# Patient Record
Sex: Female | Born: 1945 | Race: Asian | Hispanic: No | Smoking: Never smoker
Health system: Southern US, Community
[De-identification: ages and names within clinical notes are randomized; demographics above are authoritative.]

## PROBLEM LIST (undated history)

## (undated) DIAGNOSIS — E785 Hyperlipidemia, unspecified: Secondary | ICD-10-CM

## (undated) DIAGNOSIS — I10 Essential (primary) hypertension: Secondary | ICD-10-CM

## (undated) DIAGNOSIS — N390 Urinary tract infection, site not specified: Secondary | ICD-10-CM

## (undated) DIAGNOSIS — M1990 Unspecified osteoarthritis, unspecified site: Secondary | ICD-10-CM

## (undated) HISTORY — PX: CATARACT EXTRACTION: SUR2

## (undated) HISTORY — DX: Essential (primary) hypertension: I10

## (undated) HISTORY — DX: Unspecified osteoarthritis, unspecified site: M19.90

## (undated) HISTORY — DX: Urinary tract infection, site not specified: N39.0

## (undated) HISTORY — DX: Hyperlipidemia, unspecified: E78.5

---

## 2019-06-27 ENCOUNTER — Telehealth: Payer: Self-pay

## 2019-06-27 NOTE — Telephone Encounter (Signed)
Copied from [place] [id number]. Topic: General - Other >> Jun 27, 2019  3:04 PM Wynetta Emery, [place] C wrote: Reason for CRM: pt and daughter called in to change apt to in office. Daughter says that mom doesn't need interpreter because she will be with her at her apt. Called the office twice for assistance both times was advised that visit could be changed to in office but call was disconnected. Please assist pt directly if further apt instructions are needed, apt has been changed to in office per advised.

## 2019-06-28 ENCOUNTER — Other Ambulatory Visit: Payer: Self-pay

## 2019-06-30 ENCOUNTER — Encounter: Payer: Self-pay | Admitting: Internal Medicine

## 2019-06-30 ENCOUNTER — Other Ambulatory Visit: Payer: Self-pay

## 2019-06-30 ENCOUNTER — Ambulatory Visit (INDEPENDENT_AMBULATORY_CARE_PROVIDER_SITE_OTHER): Payer: Self-pay | Admitting: Internal Medicine

## 2019-06-30 VITALS — BP 126/80 | HR 70 | Temp 97.1°F | Resp 16 | Wt 135.6 lb

## 2019-06-30 DIAGNOSIS — Z1329 Encounter for screening for other suspected endocrine disorder: Secondary | ICD-10-CM

## 2019-06-30 DIAGNOSIS — I10 Essential (primary) hypertension: Secondary | ICD-10-CM

## 2019-06-30 DIAGNOSIS — R739 Hyperglycemia, unspecified: Secondary | ICD-10-CM

## 2019-06-30 DIAGNOSIS — E785 Hyperlipidemia, unspecified: Secondary | ICD-10-CM | POA: Insufficient documentation

## 2019-06-30 DIAGNOSIS — E559 Vitamin D deficiency, unspecified: Secondary | ICD-10-CM

## 2019-06-30 DIAGNOSIS — R300 Dysuria: Secondary | ICD-10-CM

## 2019-06-30 DIAGNOSIS — Z1231 Encounter for screening mammogram for malignant neoplasm of breast: Secondary | ICD-10-CM

## 2019-06-30 DIAGNOSIS — R1030 Lower abdominal pain, unspecified: Secondary | ICD-10-CM

## 2019-06-30 DIAGNOSIS — M069 Rheumatoid arthritis, unspecified: Secondary | ICD-10-CM

## 2019-06-30 MED ORDER — LISINOPRIL-HYDROCHLOROTHIAZIDE 20-25 MG PO TABS: 1.0000 | ORAL_TABLET | Freq: Every day | ORAL | 3 refills | Status: DC

## 2019-06-30 MED ORDER — CIPROFLOXACIN HCL 250 MG PO TABS: 250.0000 mg | ORAL_TABLET | Freq: Two times a day (BID) | ORAL | 0 refills | Status: DC

## 2019-06-30 MED ORDER — SIMVASTATIN 20 MG PO TABS: 20.0000 mg | ORAL_TABLET | Freq: Every day | ORAL | 3 refills | Status: DC

## 2019-06-30 MED ORDER — METOPROLOL TARTRATE 50 MG PO TABS: 50.0000 mg | ORAL_TABLET | Freq: Two times a day (BID) | ORAL | 3 refills | Status: DC

## 2019-06-30 NOTE — Patient Instructions (Addendum)
Vaccines recommendations if you have not had    Think about shingrix vaccine  Tdap vaccine (now) prevnar 13 vaccine due 04/29/2020 Flu shot high dose (now)   Trigger Finger  Trigger finger (stenosing tenosynovitis) is a condition that causes a finger to get stuck in a bent position. Each finger has a tough, cord-like tissue that connects muscle to bone (tendon), and each tendon is surrounded by a tunnel of tissue (tendon sheath). To move your finger, your tendon needs to slide freely through the sheath. Trigger finger happens when the tendon or the sheath thickens, making it difficult to move your finger. Trigger finger can affect any finger or a thumb. It may affect more than one finger. Mild cases may clear up with rest and medicine. Severe cases require more treatment. What are the causes? Trigger finger is caused by a thickened finger tendon or tendon sheath. The cause of this thickening is not known. What increases the risk? The following factors may make you more likely to develop this condition:  Doing activities that require a strong grip.  Having rheumatoid arthritis, gout, or diabetes.  Being 34-46 years old.  Being a woman. What are the signs or symptoms? Symptoms of this condition include:  Pain when bending or straightening your finger.  Tenderness or swelling where your finger attaches to the palm of your hand.  A lump in the palm of your hand or on the inside of your finger.  Hearing a popping sound when you try to straighten your finger.  Feeling a popping, catching, or locking sensation when you try to straighten your finger.  Being unable to straighten your finger. How is this diagnosed? This condition is diagnosed based on your symptoms and a physical exam. How is this treated? This condition may be treated by:  Resting your finger and avoiding activities that make symptoms worse.  Wearing a finger splint to keep your finger in a slightly bent position.   Taking NSAIDs to relieve pain and swelling.  Injecting medicine (steroids) into the tendon sheath to reduce swelling and irritation. Injections may need to be repeated.  Having surgery to open the tendon sheath. This may be done if other treatments do not work and you cannot straighten your finger. You may need physical therapy after surgery. Follow these instructions at home:   Use moist heat to help reduce pain and swelling as told by your health care provider.  Rest your finger and avoid activities that make pain worse. Return to normal activities as told by your health care provider.  If you have a splint, wear it as told by your health care provider.  Take over-the-counter and prescription medicines only as told by your health care provider.  Keep all follow-up visits as told by your health care provider. This is important. Contact a health care provider if:  Your symptoms are not improving with home care. Summary  Trigger finger (stenosing tenosynovitis) causes your finger to get stuck in a bent position, and it can make it difficult and painful to straighten your finger.  This condition develops when a finger tendon or tendon sheath thickens.  Treatment starts with resting, wearing a splint, and taking NSAIDs.  In severe cases, surgery to open the tendon sheath may be needed. This information is not intended to replace advice given to you by your health care provider. Make sure you discuss any questions you have with your health care provider. Document Released: 08/08/2004 Document Revised: 10/01/2017 Document Reviewed: 09/29/2016 Elsevier  Patient Education  2020 [place] A mammogram is an X-ray of the breasts that is done to check for changes that are not normal. This test can screen for and find any changes that may suggest breast cancer. Mammograms are regularly done on women. A man may have a mammogram if he has a lump or swelling in his breast. This  test can also help to find other changes and variations in the breast. Tell a doctor:  About any allergies you have.  If you have breast implants.  If you have had previous breast disease, biopsy, or surgery.  If you are breastfeeding.  If you are younger than age 34.  If you have a family history of breast cancer.  Whether you are pregnant or may be pregnant. What are the risks? Generally, this is a safe procedure. However, problems may occur, including:  Exposure to radiation. Radiation levels are very low with this test.  The results being misinterpreted.  The need for further tests.  The inability of the mammogram to detect certain cancers. What happens before the procedure?  Have this test done about 1-2 weeks after your period. This is usually when your breasts are the least tender.  If you are visiting a new doctor or clinic, send any past mammogram images to your new doctor's office.  Wash your breasts and under your arms the day of the test.  Do not use deodorants, perfumes, lotions, or powders on the day of the test.  Take off any jewelry from your neck.  Wear clothes that you can change into and out of easily. What happens during the procedure?   You will undress from the waist up. You will put on a gown.  You will stand in front of the X-ray machine.  Each breast will be placed between two plastic or glass plates. The plates will press down on your breast for a few seconds. Try to stay as relaxed as possible. This does not cause any harm to your breasts. Any discomfort you feel will be very brief.  X-rays will be taken from different angles of each breast. The procedure may vary among doctors and hospitals. What happens after the procedure?  The mammogram will be read by a specialist (radiologist).  You may need to do certain parts of the test again. This depends on the quality of the images.  Ask when your test results will be ready. Make sure you  get your test results.  You may go back to your normal activities. Summary  A mammogram is a low energy X-ray of the breasts that is done to check for abnormal changes. A man may have this test if he has a lump or swelling in his breast.  Before the procedure, tell your doctor about any breast problems that you have had in the past.  Have this test done about 1-2 weeks after your period.  For the test, each breast will be placed between two plastic or glass plates. The plates will press down on your breast for a few seconds.  The mammogram will be read by a specialist (radiologist). Ask when your test results will be ready. Make sure you get your test results. This information is not intended to replace advice given to you by your health care provider. Make sure you discuss any questions you have with your health care provider. Document Released: 01/15/2009 Document Revised: 06/09/2018 Document Reviewed: 06/09/2018 Elsevier Patient Education  2020 [employer].  DASH Eating Plan DASH stands for "Dietary Approaches to Stop Hypertension." The DASH eating plan is a healthy eating plan that has been shown to reduce high blood pressure (hypertension). It may also reduce your risk for type 2 diabetes, heart disease, and stroke. The DASH eating plan may also help with weight loss. What are tips for following this plan?  General guidelines  Avoid eating more than 2,300 mg (milligrams) of salt (sodium) a day. If you have hypertension, you may need to reduce your sodium intake to 1,500 mg a day.  Limit alcohol intake to no more than 1 drink a day for nonpregnant women and 2 drinks a day for men. One drink equals 12 oz of beer, 5 oz of wine, or 1 oz of hard liquor.  Work with your health care provider to maintain a healthy body weight or to lose weight. Ask what an ideal weight is for you.  Get at least 30 minutes of exercise that causes your heart to beat faster (aerobic exercise) most days of  the week. Activities may include walking, swimming, or biking.  Work with your health care provider or diet and nutrition specialist (dietitian) to adjust your eating plan to your individual calorie needs. Reading food labels   Check food labels for the amount of sodium per serving. Choose foods with less than 5 percent of the Daily Value of sodium. Generally, foods with less than 300 mg of sodium per serving fit into this eating plan.  To find whole grains, look for the word "whole" as the first word in the ingredient list. Shopping  Buy products labeled as "low-sodium" or "no salt added."  Buy fresh foods. Avoid canned foods and premade or frozen meals. Cooking  Avoid adding salt when cooking. Use salt-free seasonings or herbs instead of table salt or sea salt. Check with your health care provider or pharmacist before using salt substitutes.  Do not fry foods. Cook foods using healthy methods such as baking, boiling, grilling, and broiling instead.  Cook with heart-healthy oils, such as olive, canola, soybean, or sunflower oil. Meal planning  Eat a balanced diet that includes: ? 5 or more servings of fruits and vegetables each day. At each meal, try to fill half of your plate with fruits and vegetables. ? Up to 6-8 servings of whole grains each day. ? Less than 6 oz of lean meat, poultry, or fish each day. A 3-oz serving of meat is about the same size as a deck of cards. One egg equals 1 oz. ? 2 servings of low-fat dairy each day. ? A serving of nuts, seeds, or beans 5 times each week. ? Heart-healthy fats. Healthy fats called Omega-3 fatty acids are found in foods such as flaxseeds and coldwater fish, like sardines, salmon, and mackerel.  Limit how much you eat of the following: ? Canned or prepackaged foods. ? Food that is high in trans fat, such as fried foods. ? Food that is high in saturated fat, such as fatty meat. ? Sweets, desserts, sugary drinks, and other foods with  added sugar. ? Full-fat dairy products.  Do not salt foods before eating.  Try to eat at least 2 vegetarian meals each week.  Eat more home-cooked food and less restaurant, buffet, and fast food.  When eating at a restaurant, ask that your food be prepared with less salt or no salt, if possible. What foods are recommended? The items listed may not be a complete list. Talk with your dietitian  about what dietary choices are best for you. Grains Whole-grain or whole-wheat bread. Whole-grain or whole-wheat pasta. Brown rice. [place]. Bulgur. Whole-grain and low-sodium cereals. Pita bread. Low-fat, low-sodium crackers. Whole-wheat flour tortillas. Vegetables Fresh or frozen vegetables (raw, steamed, roasted, or grilled). Low-sodium or reduced-sodium tomato and vegetable juice. Low-sodium or reduced-sodium tomato sauce and tomato paste. Low-sodium or reduced-sodium canned vegetables. Fruits All fresh, dried, or frozen fruit. Canned fruit in natural juice (without added sugar). Meat and other protein foods Skinless chicken or [place]. Ground chicken or [place]. Pork with fat trimmed off. Fish and seafood. Egg whites. Dried beans, peas, or lentils. Unsalted nuts, nut butters, and seeds. Unsalted canned beans. Lean cuts of beef with fat trimmed off. Low-sodium, lean deli meat. Dairy Low-fat (1%) or fat-free (skim) milk. Fat-free, low-fat, or reduced-fat cheeses. Nonfat, low-sodium ricotta or cottage cheese. Low-fat or nonfat yogurt. Low-fat, low-sodium cheese. Fats and oils Soft margarine without trans fats. Vegetable oil. Low-fat, reduced-fat, or light mayonnaise and salad dressings (reduced-sodium). Canola, safflower, olive, soybean, and sunflower oils. Avocado. Seasoning and other foods Herbs. Spices. Seasoning mixes without salt. Unsalted popcorn and pretzels. Fat-free sweets. What foods are not recommended? The items listed may not be a complete list. Talk with your dietitian about what  dietary choices are best for you. Grains Baked goods made with fat, such as croissants, muffins, or some breads. Dry pasta or rice meal packs. Vegetables Creamed or fried vegetables. Vegetables in a cheese sauce. Regular canned vegetables (not low-sodium or reduced-sodium). Regular canned tomato sauce and paste (not low-sodium or reduced-sodium). Regular tomato and vegetable juice (not low-sodium or reduced-sodium). Angie Fava. Olives. Fruits Canned fruit in a light or heavy syrup. Fried fruit. Fruit in cream or butter sauce. Meat and other protein foods Fatty cuts of meat. Ribs. Fried meat. Berniece Salines. Sausage. Bologna and other processed lunch meats. Salami. Fatback. Hotdogs. Bratwurst. Salted nuts and seeds. Canned beans with added salt. Canned or smoked fish. Whole eggs or egg yolks. Chicken or [place] with skin. Dairy Whole or 2% milk, cream, and half-and-half. Whole or full-fat cream cheese. Whole-fat or sweetened yogurt. Full-fat cheese. Nondairy creamers. Whipped toppings. Processed cheese and cheese spreads. Fats and oils Butter. Stick margarine. Lard. Shortening. Ghee. Bacon fat. Tropical oils, such as coconut, palm kernel, or palm oil. Seasoning and other foods Salted popcorn and pretzels. Onion salt, garlic salt, seasoned salt, table salt, and sea salt. Worcestershire sauce. Tartar sauce. Barbecue sauce. Teriyaki sauce. Soy sauce, including reduced-sodium. Steak sauce. Canned and packaged gravies. Fish sauce. Oyster sauce. Cocktail sauce. Horseradish that you find on the shelf. Ketchup. Mustard. Meat flavorings and tenderizers. Bouillon cubes. Hot sauce and Tabasco sauce. Premade or packaged marinades. Premade or packaged taco seasonings. Relishes. Regular salad dressings. Where to find more information:  National Heart, Lung, and [place]: https://wilson-eaton.com/  American Heart Association: www.heart.org Summary  The DASH eating plan is a healthy eating plan that has been shown to reduce  high blood pressure (hypertension). It may also reduce your risk for type 2 diabetes, heart disease, and stroke.  With the DASH eating plan, you should limit salt (sodium) intake to 2,300 mg a day. If you have hypertension, you may need to reduce your sodium intake to 1,500 mg a day.  When on the DASH eating plan, aim to eat more fresh fruits and vegetables, whole grains, lean proteins, low-fat dairy, and heart-healthy fats.  Work with your health care provider or diet and nutrition specialist (dietitian) to adjust your eating plan to  your individual calorie needs. This information is not intended to replace advice given to you by your health care provider. Make sure you discuss any questions you have with your health care provider. Document Released: 10/08/2011 Document Revised: 10/01/2017 Document Reviewed: 10/12/2016 Elsevier Patient Education  2020 [employer].

## 2019-06-30 NOTE — Progress Notes (Signed)
. Chief Complaint  Patient presents with  . Establish Care   New patient presents with daughter [name]  1. HTN controlled today on metoprolol 50 mg bid and lis-hctz 20-25  Mg qd she wants refills  2. HLD need to check fating labs currently no insurance given [facility] form on zocor 20 mg qhs 3. C/o reduced vision in right eye and h/o b/l cataract surgery  4. C/o right lower back pain, right hip pain and left shoulder pain she takes Tylenol at times which helps but Ibuprofen helps more. She is wearing abdominal binder brace today to help with back pain 5. C/o lower middle abdominal pain with h/o enterococcus UTI, denies burning with urination only lower abdominal pain she does wear panty liner but no h/o incontinence   Review of Systems  Constitutional: Negative for weight loss.  HENT: Negative for hearing loss.   Eyes: Negative for blurred vision.  Respiratory: Negative for shortness of breath.   Cardiovascular: Negative for chest pain.  Gastrointestinal: Positive for abdominal pain.  Genitourinary: Negative for dysuria.  Musculoskeletal: Positive for back pain and joint pain. Negative for falls.  Skin: Negative for rash.  Neurological: Negative for headaches.  Psychiatric/Behavioral: Negative for depression.   Past Medical History:  Diagnosis Date  . Arthritis    left shoulder, right low back and right hip   . Hyperlipidemia   . Hypertension   . UTI (urinary tract infection)    Past Surgical History:  Procedure Laterality Date  . CATARACT EXTRACTION     b/l    Family History  Problem Relation Age of Onset  . Breast cancer Other    Social History   Socioeconomic History  . Marital status: Divorced    Spouse name: Not on file  . Number of children: Not on file  . Years of education: Not on file  . Highest education level: Not on file  Occupational History  . Not on file  Social Needs  . Financial resource strain: Not on file  . Food insecurity    Worry: Not on file     Inability: Not on file  . Transportation needs    Medical: Not on file    Non-medical: Not on file  Tobacco Use  . Smoking status: Never Smoker  . Smokeless tobacco: Never Used  Substance and Sexual Activity  . Alcohol use: Not Currently  . Drug use: Not Currently  . Sexual activity: Not Currently  Lifestyle  . Physical activity    Days per week: Not on file    Minutes per session: Not on file  . Stress: Not on file  Relationships  . Social Herbalist on phone: Not on file    Gets together: Not on file    Attends religious service: Not on file    Active member of club or organization: Not on file    Attends meetings of clubs or organizations: Not on file    Relationship status: Not on file  . Intimate partner violence    Fear of current or ex partner: Not on file    Emotionally abused: Not on file    Physically abused: Not on file    Forced sexual activity: Not on file  Other Topics Concern  . Not on file  Social History Narrative   4 daughters    [place] here from [place], [place].    Currently not working and pending insurance as of 06/30/19    From the [employer]  Current Meds  Medication Sig  . Ascorbic Acid (VITAMIN C PO) Take by mouth daily.  Marland Kitchen aspirin EC 81 MG tablet Take 81 mg by mouth daily.  . Cholecalciferol 25 MCG (1000 UT) capsule Take 1,000 Units by mouth daily.  Marland Kitchen GLUTAMINE PO Take by mouth daily.  Marland Kitchen lisinopril-hydrochlorothiazide (ZESTORETIC) 20-25 MG tablet Take 1 tablet by mouth daily.  . metoprolol tartrate (LOPRESSOR) 50 MG tablet Take 1 tablet (50 mg total) by mouth 2 (two) times daily.  . multivitamin-lutein (OCUVITE-LUTEIN) CAPS capsule Take 1 capsule by mouth daily.  . Omega-3 Fatty Acids (OMEGA 3 PO) Take by mouth daily.  . simvastatin (ZOCOR) 20 MG tablet Take 1 tablet (20 mg total) by mouth daily at 6 PM.  . [DISCONTINUED] lisinopril-hydrochlorothiazide (ZESTORETIC) 20-25 MG tablet Take 1 tablet by mouth daily.  . [DISCONTINUED]  metoprolol tartrate (LOPRESSOR) 50 MG tablet Take 50 mg by mouth 2 (two) times daily.  . [DISCONTINUED] simvastatin (ZOCOR) 20 MG tablet Take 20 mg by mouth daily.   No Known Allergies No results found for this or any previous visit (from the past 2160 hour(s)). Objective  There is no height or weight on file to calculate BMI. Wt Readings from Last 3 Encounters:  06/30/19 135 lb 9.6 oz (61.5 kg)   Temp Readings from Last 3 Encounters:  06/30/19 (!) 97.1 F (36.2 C) (Temporal)   BP Readings from Last 3 Encounters:  06/30/19 126/80   Pulse Readings from Last 3 Encounters:  06/30/19 70    Physical Exam Vitals signs and nursing note reviewed.  Constitutional:      Appearance: Normal appearance. She is well-developed.  HENT:     Head: Normocephalic and atraumatic.     Comments: +mask on   Cardiovascular:     Rate and Rhythm: Normal rate and regular rhythm.     Heart sounds: Normal heart sounds. No murmur.  Pulmonary:     Effort: Pulmonary effort is normal.     Breath sounds: Normal breath sounds.  Musculoskeletal:     Lumbar back: She exhibits tenderness.       Back:  Skin:    General: Skin is warm and dry.  Neurological:     General: No focal deficit present.     Mental Status: She is alert and oriented to person, place, and time. Mental status is at baseline.     Gait: Gait normal.  Psychiatric:        Attention and Perception: Attention and perception normal.        Mood and Affect: Mood and affect normal.        Speech: Speech normal.        Behavior: Behavior normal. Behavior is cooperative.        Thought Content: Thought content normal.        Cognition and Memory: Cognition and memory normal.        Judgment: Judgment normal.     Assessment  Plan  Essential hypertension, controlled - Plan: metoprolol tartrate (LOPRESSOR) 50 MG tablet mg bid, lisinopril-hydrochlorothiazide (ZESTORETIC) 20-25 MG tablet qd -given fasting labs to do a [facility] pt is self pay    Lower suprapubic abdominal pain r/o UTI with h/o enterococcus UTI - Plan: Urinalysis, Routine w reflex microscopic, Urine Culture, ciprofloxacin (CIPRO) 250 MG tablet, -if urine culture negative consider abdominal imaging with CT ab/pelvis in future currently w/o insurance   Hyperlipidemia, unspecified hyperlipidemia type - Plan: simvastatin (ZOCOR) 20 MG tablet, Lipid panel  Hyperglycemia -  Plan: Hemoglobin A1c  Arthritis likely low back right and right hip and left shoulder  -consider Xrays in future   HM She will get flu shot elsewhere due to cost currently no insurance also rec health dept rec Tdap, shingrix, prevnar if not had  She does report she had pna 23 vaccine 04/30/19   Consider mammogram when she gets insurance ordered Teola Bradley it is $99 Consider cologuard when she gets insurance  Out of age window pap  Consider DEXA in future  Will need referral to eye MD when gets insurance right eye bothersome to her   Former PCP Dr. Marguerita Beards in [place] try to get records   Provider: Dr. Olivia Mackie McLean-[facility]

## 2019-07-01 LAB — MICROSCOPIC EXAMINATION: Casts: NONE SEEN /lpf

## 2019-07-01 LAB — URINALYSIS, ROUTINE W REFLEX MICROSCOPIC
Bilirubin, UA: NEGATIVE
Glucose, UA: NEGATIVE
Ketones, UA: NEGATIVE
Nitrite, UA: NEGATIVE
Protein,UA: NEGATIVE
RBC, UA: NEGATIVE
Specific Gravity, UA: 1.008 (ref 1.005–1.030)
Urobilinogen, Ur: 0.2 mg/dL (ref 0.2–1.0)
pH, UA: 6.5 (ref 5.0–7.5)

## 2019-07-02 LAB — URINE CULTURE

## 2019-08-18 ENCOUNTER — Emergency Department: Payer: Medicaid Other

## 2019-08-18 ENCOUNTER — Emergency Department
Admission: EM | Admit: 2019-08-18 | Discharge: 2019-08-18 | Disposition: A | Discharge status: Home / Self Care | Payer: Medicaid Other | Attending: Emergency Medicine | Admitting: Emergency Medicine

## 2019-08-18 ENCOUNTER — Other Ambulatory Visit: Payer: Self-pay

## 2019-08-18 ENCOUNTER — Encounter: Payer: Self-pay | Admitting: Emergency Medicine

## 2019-08-18 DIAGNOSIS — K3189 Other diseases of stomach and duodenum: Secondary | ICD-10-CM | POA: Diagnosis not present

## 2019-08-18 DIAGNOSIS — Z7982 Long term (current) use of aspirin: Secondary | ICD-10-CM | POA: Diagnosis not present

## 2019-08-18 DIAGNOSIS — R1011 Right upper quadrant pain: Secondary | ICD-10-CM | POA: Diagnosis present

## 2019-08-18 DIAGNOSIS — Z79899 Other long term (current) drug therapy: Secondary | ICD-10-CM | POA: Diagnosis not present

## 2019-08-18 DIAGNOSIS — I10 Essential (primary) hypertension: Secondary | ICD-10-CM | POA: Diagnosis not present

## 2019-08-18 LAB — URINALYSIS, ROUTINE W REFLEX MICROSCOPIC
Bilirubin Urine: NEGATIVE
Glucose, UA: NEGATIVE mg/dL
Hgb urine dipstick: NEGATIVE
Ketones, ur: NEGATIVE mg/dL
Leukocytes,Ua: NEGATIVE
Nitrite: NEGATIVE
Protein, ur: NEGATIVE mg/dL
Specific Gravity, Urine: 1.014 (ref 1.005–1.030)
pH: 7 (ref 5.0–8.0)

## 2019-08-18 LAB — BASIC METABOLIC PANEL
Anion gap: 8 (ref 5–15)
BUN: 12 mg/dL (ref 8–23)
CO2: 28 mmol/L (ref 22–32)
Calcium: 9.2 mg/dL (ref 8.9–10.3)
Chloride: 96 mmol/L — ABNORMAL LOW (ref 98–111)
Creatinine, Ser: 0.82 mg/dL (ref 0.44–1.00)
GFR calc Af Amer: >60 mL/min (ref >60)
GFR calc non Af Amer: >60 mL/min (ref >60)
Glucose, Bld: 97 mg/dL (ref 70–99)
Potassium: 3.9 mmol/L (ref 3.5–5.1)
Sodium: 132 mmol/L — ABNORMAL LOW (ref 135–145)

## 2019-08-18 LAB — COMPREHENSIVE METABOLIC PANEL
ALT: 23 U/L (ref 0–44)
AST: 26 U/L (ref 15–41)
Albumin: 4.2 g/dL (ref 3.5–5.0)
Alkaline Phosphatase: 53 U/L (ref 38–126)
Anion gap: 11 (ref 5–15)
BUN: 16 mg/dL (ref 8–23)
CO2: 27 mmol/L (ref 22–32)
Calcium: 9.1 mg/dL (ref 8.9–10.3)
Chloride: 89 mmol/L — ABNORMAL LOW (ref 98–111)
Creatinine, Ser: 0.82 mg/dL (ref 0.44–1.00)
GFR calc Af Amer: >60 mL/min (ref >60)
GFR calc non Af Amer: >60 mL/min (ref >60)
Glucose, Bld: 114 mg/dL — ABNORMAL HIGH (ref 70–99)
Potassium: 4 mmol/L (ref 3.5–5.1)
Sodium: 127 mmol/L — ABNORMAL LOW (ref 135–145)
Total Bilirubin: 0.7 mg/dL (ref 0.3–1.2)
Total Protein: 7.3 g/dL (ref 6.5–8.1)

## 2019-08-18 LAB — CBC WITH DIFFERENTIAL/PLATELET
Abs Immature Granulocytes: 0.01 10*3/uL (ref 0.00–0.07)
Basophils Absolute: 0 10*3/uL (ref 0.0–0.1)
Basophils Relative: 1 %
Eosinophils Absolute: 0.3 10*3/uL (ref 0.0–0.5)
Eosinophils Relative: 6 %
HCT: 35.2 % — ABNORMAL LOW (ref 36.0–46.0)
Hemoglobin: 12.2 g/dL (ref 12.0–15.0)
Immature Granulocytes: 0 %
Lymphocytes Relative: 23 %
Lymphs Abs: 0.9 10*3/uL (ref 0.7–4.0)
MCH: 29.8 pg (ref 26.0–34.0)
MCHC: 34.7 g/dL (ref 30.0–36.0)
MCV: 85.9 fL (ref 80.0–100.0)
Monocytes Absolute: 0.4 10*3/uL (ref 0.1–1.0)
Monocytes Relative: 10 %
Neutro Abs: 2.4 10*3/uL (ref 1.7–7.7)
Neutrophils Relative %: 60 %
Platelets: 219 10*3/uL (ref 150–400)
RBC: 4.1 MIL/uL (ref 3.87–5.11)
RDW: 12 % (ref 11.5–15.5)
WBC: 4 10*3/uL (ref 4.0–10.5)
nRBC: 0 % (ref 0.0–0.2)

## 2019-08-18 LAB — LIPASE, BLOOD: Lipase: 39 U/L (ref 11–51)

## 2019-08-18 MED ORDER — IOHEXOL 300 MG/ML  SOLN
100.0000 mL | Freq: Once | INTRAMUSCULAR | Status: AC | PRN
  Administered 2019-08-18: 100 mL via INTRAVENOUS

## 2019-08-18 MED ORDER — FENTANYL CITRATE (PF) 100 MCG/2ML IJ SOLN
25.0000 ug | Freq: Once | INTRAMUSCULAR | Status: AC
  Administered 2019-08-18: 25 ug via INTRAVENOUS
  Filled 2019-08-18: qty 2

## 2019-08-18 MED ORDER — ONDANSETRON HCL 4 MG/2ML IJ SOLN
4.0000 mg | Freq: Once | INTRAMUSCULAR | Status: AC
  Administered 2019-08-18: 4 mg via INTRAVENOUS
  Filled 2019-08-18: qty 2

## 2019-08-18 MED ORDER — SODIUM CHLORIDE 0.9 % IV BOLUS
500.0000 mL | Freq: Once | INTRAVENOUS | Status: AC
  Administered 2019-08-18: 500 mL via INTRAVENOUS

## 2019-08-18 NOTE — ED Triage Notes (Signed)
Pt c/o lower abdominal pain that has been intermittent for 2 years. Daughter reports they have given lot of abx and told her it was UTI.  No vomiting or fever.  No diarrhea.  Family/patient unable to give detailed history of abdominal pain.

## 2019-08-18 NOTE — ED Notes (Signed)
Pt unhooked to urinate independently.

## 2019-08-18 NOTE — ED Provider Notes (Signed)
[facility] Emergency Department Provider Note  ____________________________________________   First MD Initiated Contact with Patient 08/18/19 [phone]     (approximate)  I have reviewed the triage vital signs and the nursing notes.   HISTORY  Chief Complaint Abdominal Pain    HPI Chelsey Lynch is a 73 y.o. female with hypertension, hyperlipidemia, recurrent UTIs who presents with lower abdominal pain.  Patient had intermittent lower abdominal pain for the past 2 months.  She says it before that it was going on for approximately 2 years but has been getting antibiotics on and off for recurrent UTIs that seem to be helping the pain.  The pain is moderate, intermittent, occasionally better with antibiotics, nothing makes it worse.  Patient has not had a colonoscopy, mammograms given patient is from the [place] and is currently working on [employer].  They is put in for approval 2 months ago.  They were told that she should have a CT scan done but given the insurance issue they have not been able to do it.  They presented today due to the pain getting increasingly worse than previous.  Denies any chest pain or shortness of breath.  Has a chronic cough.  No prior surgeries.       Past Medical History:  Diagnosis Date   Arthritis    left shoulder, right low back and right hip    Hyperlipidemia    Hypertension    UTI (urinary tract infection)     Patient Active Problem List   Diagnosis Date Noted   Essential hypertension 06/30/2019   Lower abdominal pain 06/30/2019   Hyperlipidemia 06/30/2019    Past Surgical History:  Procedure Laterality Date   CATARACT EXTRACTION     b/l     Prior to Admission medications   Medication Sig Start Date End Date Taking? Authorizing Provider  Ascorbic Acid (VITAMIN C PO) Take by mouth daily.    [provider]  aspirin EC 81 MG tablet Take 81 mg by mouth daily.    [provider]    Cholecalciferol 25 MCG (1000 UT) capsule Take 1,000 Units by mouth daily.    [provider]  ciprofloxacin (CIPRO) 250 MG tablet Take 1 tablet (250 mg total) by mouth 2 (two) times daily. With food 06/30/19   McLean-Scocuzza, Nino Glow, MD  GLUTAMINE PO Take by mouth daily.    [provider]  lisinopril-hydrochlorothiazide (ZESTORETIC) 20-25 MG tablet Take 1 tablet by mouth daily. 06/30/19   McLean-Scocuzza, Nino Glow, MD  metoprolol tartrate (LOPRESSOR) 50 MG tablet Take 1 tablet (50 mg total) by mouth 2 (two) times daily. 06/30/19   McLean-Scocuzza, Nino Glow, MD  multivitamin-lutein [facility]) CAPS capsule Take 1 capsule by mouth daily.    [provider]  Omega-3 Fatty Acids (OMEGA 3 PO) Take by mouth daily.    [provider]  simvastatin (ZOCOR) 20 MG tablet Take 1 tablet (20 mg total) by mouth daily at 6 PM. 06/30/19   McLean-Scocuzza, Nino Glow, MD    Allergies Patient has no known allergies.  Family History  Problem Relation Age of Onset   Breast cancer Other     Social History Social History   Tobacco Use   Smoking status: Never Smoker   Smokeless tobacco: Never Used  Substance Use Topics   Alcohol use: Not Currently   Drug use: Not Currently      Review of Systems Constitutional: No fever/chills Eyes: No visual changes. ENT: No sore  throat. Cardiovascular: Denies chest pain. Respiratory: Denies shortness of breath.  Positive cough Gastrointestinal: Positive abdominal pain no nausea, no vomiting.  No diarrhea.  No constipation. Genitourinary: Negative for dysuria. Musculoskeletal: Negative for back pain. Skin: Negative for rash. Neurological: Negative for headaches, focal weakness or numbness. All other ROS negative ____________________________________________   PHYSICAL EXAM:  VITAL SIGNS: ED Triage Vitals  Enc Vitals Group     BP 08/18/19 0749 (!) 147/93     Pulse Rate 08/18/19 0749 69     Resp 08/18/19 0749 18      Temp 08/18/19 0749 98.8 F (37.1 C)     Temp Source 08/18/19 0749 Oral     SpO2 08/18/19 0749 100 %     Weight 08/18/19 0747 127 lb (57.6 kg)     Height --      Head Circumference --      Peak Flow --      Pain Score 08/18/19 0745 9     Pain Loc --      Pain Edu? --      Excl. in [place]? --     Constitutional: Laying in bed, moaning in pain.. Eyes: Conjunctivae are normal. EOMI. Head: Atraumatic. Nose: No congestion/rhinnorhea. Mouth/Throat: Mucous membranes are moist.   Neck: No stridor. Trachea Midline. FROM Cardiovascular: Normal rate, regular rhythm. Grossly normal heart sounds.  Good peripheral circulation. Respiratory: Normal respiratory effort.  No retractions. Lungs CTAB. Gastrointestinal: Soft with tenderness in the lower abdomen.. No distention. No abdominal bruits.  Musculoskeletal: No lower extremity tenderness nor edema.  No joint effusions. Neurologic:  Normal speech and language. No gross focal neurologic deficits are appreciated.  Skin:  Skin is warm, dry and intact. No rash noted. Psychiatric: Mood and affect are normal. Speech and behavior are normal. GU: Deferred   ____________________________________________   LABS (all labs ordered are listed, but only abnormal results are displayed)  Labs Reviewed  CBC WITH DIFFERENTIAL/PLATELET - Abnormal; Notable for the following components:      Result Value   HCT 35.2 (*)    All other components within normal limits  COMPREHENSIVE METABOLIC PANEL - Abnormal; Notable for the following components:   Sodium 127 (*)    Chloride 89 (*)    Glucose, Bld 114 (*)    All other components within normal limits  URINALYSIS, ROUTINE W REFLEX MICROSCOPIC - Abnormal; Notable for the following components:   Color, Urine STRAW (*)    APPearance CLEAR (*)    All other components within normal limits  URINE CULTURE  LIPASE, BLOOD  BASIC METABOLIC PANEL   ____________________________________________   ED ECG REPORT I, Vanessa Westway, the attending physician, personally viewed and interpreted this ECG.  EKG is normal sinus rate of 59, no ST elevation, no T wave inversion, normal intervals. ____________________________________________  RADIOLOGY   Official radiology report(s): Ct Abdomen Pelvis W Contrast  Result Date: 08/18/2019 CLINICAL DATA:  Acute abdominal pain EXAM: CT ABDOMEN AND PELVIS WITH CONTRAST TECHNIQUE: Multidetector CT imaging of the abdomen and pelvis was performed using the standard protocol following bolus administration of intravenous contrast. CONTRAST:  152mL OMNIPAQUE IOHEXOL 300 MG/ML  SOLN COMPARISON:  None. FINDINGS: Lower chest: There is mild bibasilar atelectasis. Hepatobiliary: Multiple cysts are seen in the left and right hepatic lobes. A mass in the right hepatic lobe measuring 3.7 x 2.2 cm demonstrates peripheral puddling and progressive enhancement, consistent with a benign hemangioma. The common bile duct measures up to 9 mm in diameter (series  5, image 38). No gallstones or gallbladder wall thickening. Pancreas: Unremarkable. No pancreatic ductal dilatation or surrounding inflammatory changes. Spleen: Normal in size without focal abnormality. Adrenals/Urinary Tract: Adrenal glands are unremarkable. Kidneys are normal, without renal calculi, focal lesion, or hydronephrosis. Bladder is unremarkable. Stomach/Bowel: A 2.6 cm endoluminal mass is seen in the lesser curvature of the gastric body. Appendix appears normal. No evidence of bowel distention or inflammatory changes. Vascular/Lymphatic: Aortic atherosclerosis. No enlarged abdominal or pelvic lymph nodes. Reproductive: Uterus and bilateral adnexa are unremarkable. Other: No abdominal wall hernia or abnormality. No abdominopelvic ascites. Musculoskeletal: No acute or significant osseous findings. IMPRESSION: 1. No acute findings are evident in the abdomen or pelvis. 2. Endoluminal mass measuring 2.6 cm in the lesser curvature of the gastric  body. This is nonspecific, but the primary differential consideration is gastrointestinal stromal tumor. 3. Mild dilatation of the common bile duct up to 9 mm in diameter. No gallstones or gallbladder wall thickening. 4. Aortic atherosclerosis. Aortic Atherosclerosis (ICD10-I70.0). Critical Value/emergent results were called by telephone at the time of interpretation on 08/18/2019 at 9:54 am to provider Dr. Conni Slipper, who verbally acknowledged these results. Electronically Signed   By: Zerita Boers M.D.   On: 08/18/2019 09:59   Dg Chest Portable 1 View  Result Date: 08/18/2019 CLINICAL DATA:  Lower abdominal pain. Hypertension and history of UTI. EXAM: PORTABLE CHEST 1 VIEW COMPARISON:  None. FINDINGS: Aortic atherosclerosis. Mild cardiac enlargement. No pleural effusion. No airspace consolidation. No acute or significant osseous findings. IMPRESSION: No acute cardiopulmonary abnormalities. Aortic Atherosclerosis (ICD10-I70.0). Electronically Signed   By: Kerby Moors M.D.   On: 08/18/2019 08:39   US Abdomen Limited Ruq  Result Date: 08/18/2019 CLINICAL DATA:  Right upper quadrant pain. EXAM: ULTRASOUND ABDOMEN LIMITED RIGHT UPPER QUADRANT COMPARISON:  None. FINDINGS: Gallbladder: No gallstones or wall thickening visualized. No sonographic Murphy sign noted by sonographer. Common bile duct: Diameter: 3 mm in its proximal portion. The distal common bile duct which measured 9 mm on prior CT is not imaged. Liver: Hepatic cysts are seen in the left and right lobes. The cyst in the left lobe measures 2.9 x 2.7 x 4.1 cm. A septated cyst in the right lobe measures 1.6 x 1.8 x 1.9 cm. A hyperechoic mass in the right hepatic lobe measures 3.0 x 2.3 x 3.6 cm and is consistent with the benign hemangioma seen on prior CT. Within normal limits in parenchymal echogenicity. Portal vein is patent on color Doppler imaging with normal direction of blood flow towards the liver. Other: None. IMPRESSION: 1.  No findings  to explain right upper quadrant pain. 2. Nondilated proximal common bile duct. Of note, the distal common bile duct which measured 9 mm on prior CT is not imaged on this exam. Electronically Signed   By: Zerita Boers M.D.   On: 08/18/2019 11:47    ____________________________________________   PROCEDURES  Procedure(s) performed (including Critical Care):  Procedures   ____________________________________________   INITIAL IMPRESSION / ASSESSMENT AND PLAN / ED COURSE  Chelsey Lynch was evaluated in Emergency Department on 08/18/2019 for the symptoms described in the history of present illness. She was evaluated in the context of the global COVID-19 pandemic, which necessitated consideration that the patient might be at risk for infection with the SARS-CoV-2 virus that causes COVID-19. Institutional protocols and algorithms that pertain to the evaluation of patients at risk for COVID-19 are in a state of rapid change based on information released by regulatory bodies including the  CDC and federal and [employer]. These policies and algorithms were followed during the patient's care in the ED.    Patient is a 73 year old who presents with lower abdominal pain that is been intermittent for 2 years but acutely worsening today.  Patient is moaning in bed.  Patient does have normal vital signs except for slightly hypertensive however given patient does appear to be uncomfortable will get CT imaging to evaluate for abdominal mass, perforation, SBO.  This does not seem to be consistent with UTI but will get UA to further evaluate.  Given her cough will get chest x-ray to evaluate for pneumonia.    Patient is slightly hyponatremic at 127 and a chloride of 89.  Patient was given 500 cc of fluid given she appeared dry.  Patient will need to follow-up within a week for recheck.  Patient has no altered mental status or seizures to suggesting admission.  UA is negative.  Patient does have a  endoluminal mass measuring 2.6 cm in the gastric body there is also mild dilation of the CBD to 9 mm.  Will get ultrasound to further evaluate.  Ultrasound was negative.  I discussed the results with the GI doctor Tahiliani.  She is okay with patient following up outpatient for EGD and EUS.  We will give patient referral.  Chest x-ray without any evidence of pneumonia.  Repeat sodium 132.  Reevaluated patient again seems to be better.  Discussed with patient's daughter about results and need to follow-up with GI.  Expressed understanding. Patient is tolerating p.o. and is otherwise well-appearing.  Recommended symptomatic treatment with Tylenol and ibuprofen at home and to follow-up with GI.  I discussed the provisional nature of ED diagnosis, the treatment so far, the ongoing plan of care, follow up appointments and return precautions with the patient and any family or support people present. They expressed understanding and agreed with the plan, discharged home.    ____________________________________________   FINAL CLINICAL IMPRESSION(S) / ED DIAGNOSES   Final diagnoses:  Right upper quadrant abdominal pain  Gastric mass      MEDICATIONS GIVEN DURING THIS VISIT:  Medications  fentaNYL (SUBLIMAZE) injection 25 mcg (25 mcg Intravenous Given 08/18/19 0820)  ondansetron (ZOFRAN) injection 4 mg (4 mg Intravenous Given 08/18/19 0820)  sodium chloride 0.9 % bolus 500 mL (0 mLs Intravenous Stopped 08/18/19 1233)  iohexol (OMNIPAQUE) 300 MG/ML solution 100 mL (100 mLs Intravenous Contrast Given 08/18/19 0914)     ED Discharge Orders    None       Note:  This document was prepared using Dragon voice recognition software and may include unintentional dictation errors.   Vanessa [place], MD 08/18/19 [phone]

## 2019-08-18 NOTE — Discharge Instructions (Addendum)
CT scan was concerning for stomach mass.   Take 1g tylenol every 8 hours as needed and ibuprofen 400 every 8 hours as needed for pain.  Follow-up with your GI doctor for further testing for this mass.  Return to the ER for consistent vomiting, fevers or worsening pain or any other concerns.     1. No acute findings are evident in the abdomen or pelvis. 2. Endoluminal mass measuring 2.6 cm in the lesser curvature of the gastric body. This is nonspecific, but the primary differential consideration is gastrointestinal stromal tumor. 3. Mild dilatation of the common bile duct up to 9 mm in diameter. No gallstones or gallbladder wall thickening. 4. Aortic atherosclerosis.

## 2019-08-19 LAB — URINE CULTURE: Culture: NO GROWTH

## 2019-10-13 ENCOUNTER — Encounter: Payer: Self-pay | Admitting: Internal Medicine

## 2019-10-13 ENCOUNTER — Other Ambulatory Visit: Payer: Self-pay

## 2019-10-13 ENCOUNTER — Ambulatory Visit (INDEPENDENT_AMBULATORY_CARE_PROVIDER_SITE_OTHER): Payer: Medicaid Other | Admitting: Internal Medicine

## 2019-10-13 VITALS — BP 130/79 | Ht 62.0 in | Wt 135.0 lb

## 2019-10-13 DIAGNOSIS — E559 Vitamin D deficiency, unspecified: Secondary | ICD-10-CM | POA: Diagnosis not present

## 2019-10-13 DIAGNOSIS — I10 Essential (primary) hypertension: Secondary | ICD-10-CM | POA: Diagnosis not present

## 2019-10-13 DIAGNOSIS — Z13220 Encounter for screening for lipoid disorders: Secondary | ICD-10-CM | POA: Diagnosis not present

## 2019-10-13 DIAGNOSIS — E871 Hypo-osmolality and hyponatremia: Secondary | ICD-10-CM

## 2019-10-13 DIAGNOSIS — Z13818 Encounter for screening for other digestive system disorders: Secondary | ICD-10-CM

## 2019-10-13 DIAGNOSIS — Z1231 Encounter for screening mammogram for malignant neoplasm of breast: Secondary | ICD-10-CM

## 2019-10-13 DIAGNOSIS — C49A2 Gastrointestinal stromal tumor of stomach: Secondary | ICD-10-CM

## 2019-10-13 DIAGNOSIS — R739 Hyperglycemia, unspecified: Secondary | ICD-10-CM

## 2019-10-13 MED ORDER — AMLODIPINE BESYLATE 2.5 MG PO TABS: 2.5000 mg | ORAL_TABLET | Freq: Every day | ORAL | 3 refills | Status: DC

## 2019-10-13 NOTE — Patient Instructions (Signed)
Gastrointestinal Stromal Tumors  Gastrointestinal stromal tumors (GISTs) are tumors that grow in the gastrointestinal (GI) tract, usually inside the stomach or small intestine. Some GISTs are not cancerous (are benign). Others are cancerous (malignant). GISTs grow when cells called ICCs (interstitial cells of [place]) develop in the GI tract. What are the causes? This condition is caused by abnormal changes in genes (gene mutations). The exact cause of these changes is usually not known. What increases the risk? The following factors may make you more likely to develop this condition:  Having a rare kind of genetic syndrome.  Having a family history of GIST. What are the signs or symptoms? Symptoms of this condition include:  Pressure in the abdomen.  Nausea.  Vomiting, or vomiting blood.  Loss of appetite.  Weight loss.  Anemia.  Feeling tired (fatigue). How is this diagnosed? This condition may be diagnosed based on:  Your symptoms, your medical history, and a physical exam.  Tests, such as: ? CT or MRI scan. ? Endoscopy. In this test, a thin lighted tube with a camera at the end (endoscope) is inserted into the GI tract. ? Biopsy. A sample of tissue from the tumor is removed so it can be examined under a microscope. How is this treated? Treatment for this condition depends on the location of the tumor, whether it is cancerous, and whether cancer has spread. Treatment may include:  Surgery to help remove the tumor from your body.  Medicine to attack a tumor's genes and proteins (targeted therapy). These medicines attack the genes and proteins that allow a tumor to grow while limiting damage to healthy cells.  A procedure in which an electric current is used to heat the tumor (radiofrequency ablation) or a procedure to inject the tumor with heat, cold, or alcohol (ethanol ablation). These procedures may be done if a tumor has spread to the liver. Your health care provider  may monitor your condition by performing an endoscopy a few times a year. Follow these instructions at home:  Learn about your disease and possible side effects from treatment. This will help you talk about your choices with your health care provider and decide on treatments that are right for you.  Take over-the-counter and prescription medicines only as told by your health care provider.  Return to your normal activities as told by your health care provider. Ask your health care provider what activities are safe for you.  Eat a healthy diet. A healthy diet includes lots of fruits and vegetables, low-fat dairy products, lean meats, and fiber. ? Make sure half your plate is filled with fruits or vegetables. ? Choose high-fiber foods such as whole-grain breads and cereals.  Limit alcohol intake to no more than 1 drink a day for nonpregnant women and 2 drinks a day for men. One drink equals 12 oz of beer, 5 oz of wine, or 1 oz of hard liquor. You may be instructed to avoid alcohol completely.  Get regular exercise. Aim for 30 minutes of moderate-intensity activity 5 times a week. Examples of moderate-intensity activity include walking and yoga. Be sure to talk with your health care provider before starting any exercise routine.  Keep all follow-up visits as told by your health care provider. This is important. Contact a health care provider if:  Your stools are black in color. Get help right away if:  Your are unable to eat or drink without vomiting.  You are vomiting blood. Summary  Gastrointestinal stromal tumors (GISTs) are tumors  that grow in the gastrointestinal (GI) tract, usually inside the stomach or small intestine.  Symptoms of this condition include pressure in the abdomen, nausea, vomiting, and weight loss.  Treatment may include surgery to remove the tumor from your body. This information is not intended to replace advice given to you by your health care provider. Make  sure you discuss any questions you have with your health care provider. Document Released: 09/08/2016 Document Revised: 10/01/2017 Document Reviewed: 09/08/2016 Elsevier Patient Education  2020 [employer].

## 2019-10-13 NOTE — Progress Notes (Signed)
Virtual Visit via Video Note  I connected with Chelsey Lynch  on 10/13/19 at  2:00 PM EST by a video enabled telemedicine application and verified that I am speaking with the correct person using two identifiers.  Location patient: home Location provider:work or home office Persons participating in the virtual visit: patient, provider, pts daughter [name]  I discussed the limitations of evaluation and management by telemedicine and the availability of in person appointments. The patient expressed understanding and agreed to proceed.   HPI: 08/18/19 CT abnormal c/w 2.6 cm GIST tumor new dx appt 10/23/19 Dr. Darene Lamer will also refer to [facility]   HTN BP elevating 120s/130s/mid to high 80s on lis hctz 20-25 and BB     ROS: See pertinent positives and negatives per HPI.  Past Medical History:  Diagnosis Date  . Arthritis    left shoulder, right low back and right hip   . Hyperlipidemia   . Hypertension   . UTI (urinary tract infection)     Past Surgical History:  Procedure Laterality Date  . CATARACT EXTRACTION     b/l     Family History  Problem Relation Age of Onset  . Breast cancer Other     SOCIAL HX: lives with daughter    Current Outpatient Medications:  .  Ascorbic Acid (VITAMIN C PO), Take by mouth daily., Disp: , Rfl:  .  aspirin EC 81 MG tablet, Take 81 mg by mouth daily., Disp: , Rfl:  .  Cholecalciferol 25 MCG (1000 UT) capsule, Take 1,000 Units by mouth daily., Disp: , Rfl:  .  ciprofloxacin (CIPRO) 250 MG tablet, Take 1 tablet (250 mg total) by mouth 2 (two) times daily. With food, Disp: 10 tablet, Rfl: 0 .  GLUTAMINE PO, Take by mouth daily., Disp: , Rfl:  .  lisinopril-hydrochlorothiazide (ZESTORETIC) 20-25 MG tablet, Take 1 tablet by mouth daily., Disp: 90 tablet, Rfl: 3 .  metoprolol tartrate (LOPRESSOR) 50 MG tablet, Take 1 tablet (50 mg total) by mouth 2 (two) times daily., Disp: 180 tablet, Rfl: 3 .  multivitamin-lutein (OCUVITE-LUTEIN) CAPS capsule, Take 1 capsule  by mouth daily., Disp: , Rfl:  .  Omega-3 Fatty Acids (OMEGA 3 PO), Take by mouth daily., Disp: , Rfl:  .  simvastatin (ZOCOR) 20 MG tablet, Take 1 tablet (20 mg total) by mouth daily at 6 PM., Disp: 90 tablet, Rfl: 3 .  amLODipine (NORVASC) 2.5 MG tablet, Take 1 tablet (2.5 mg total) by mouth daily. If BP>130/>80, Disp: 90 tablet, Rfl: 3  EXAM:  VITALS per patient if applicable:  GENERAL: alert, oriented, appears well and in no acute distress  HEENT: atraumatic, conjunttiva clear, no obvious abnormalities on inspection of external nose and ears  NECK: normal movements of the head and neck  LUNGS: on inspection no signs of respiratory distress, breathing rate appears normal, no obvious gross SOB, gasping or wheezing  CV: no obvious cyanosis  MS: moves all visible extremities without noticeable abnormality  PSYCH/NEURO: pleasant and cooperative, no obvious depression or anxiety, speech and thought processing grossly intact  ASSESSMENT AND PLAN:  Discussed the following assessment and plan:  Essential hypertension - Plan: amLODipine (NORVASC) 2.5 MG tablet if BP >130/>80 with other meds  Consider d/c hctz if hypoNa continues  Comprehensive metabolic panel, Lipid panel  Hyperglycemia - Plan: HgB A1c  Vitamin D deficiency - Plan: Vitamin D (25 hydroxy)  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 Caleen Jobs asap 2.6  cm tumor   HM Flu shot utd rec Tdap, shingrix, prevnar if not had  She does report she had pna 23 vaccine 04/30/19   mammo referred  Consider cologuard when she gets insurance  Out of age window pap  Consider DEXA in future  Will need referral to eye MD when gets insurance right eye bothersome to her   Former PCP Dr. Marguerita Beards in [place] try to get records  -we discussed possible serious and likely etiologies, options for evaluation and workup, limitations of telemedicine visit vs in person visit, treatment,  treatment risks and precautions. Pt prefers to treat via telemedicine empirically rather then risking or undertaking an in person visit at this moment. Patient agrees to seek prompt in person care if worsening, new symptoms arise, or if is not improving with treatment.   I discussed the assessment and treatment plan with the patient. The patient was provided an opportunity to ask questions and all were answered. The patient agreed with the plan and demonstrated an understanding of the instructions.   The patient was advised to call back or seek an in-person evaluation if the symptoms worsen or if the condition fails to improve as anticipated.  Time spent 25 minutes  [name], MD

## 2019-10-23 ENCOUNTER — Ambulatory Visit: Payer: Self-pay | Admitting: Gastroenterology

## 2019-11-17 ENCOUNTER — Other Ambulatory Visit: Payer: Self-pay

## 2019-11-17 ENCOUNTER — Other Ambulatory Visit (INDEPENDENT_AMBULATORY_CARE_PROVIDER_SITE_OTHER): Payer: Medicaid Other

## 2019-11-17 DIAGNOSIS — R739 Hyperglycemia, unspecified: Secondary | ICD-10-CM | POA: Diagnosis not present

## 2019-11-17 DIAGNOSIS — I10 Essential (primary) hypertension: Secondary | ICD-10-CM

## 2019-11-17 DIAGNOSIS — E559 Vitamin D deficiency, unspecified: Secondary | ICD-10-CM | POA: Diagnosis not present

## 2019-11-17 DIAGNOSIS — E871 Hypo-osmolality and hyponatremia: Secondary | ICD-10-CM | POA: Diagnosis not present

## 2019-11-17 DIAGNOSIS — Z13220 Encounter for screening for lipoid disorders: Secondary | ICD-10-CM

## 2019-11-17 DIAGNOSIS — Z13818 Encounter for screening for other digestive system disorders: Secondary | ICD-10-CM

## 2019-11-17 LAB — LIPID PANEL
Cholesterol: 155 mg/dL (ref 0–200)
HDL: 68.8 mg/dL (ref >39.00)
LDL Cholesterol: 72 mg/dL (ref 0–99)
NonHDL: 86.41
Total CHOL/HDL Ratio: 2
Triglycerides: 70 mg/dL (ref 0.0–149.0)
VLDL: 14 mg/dL (ref 0.0–40.0)

## 2019-11-17 LAB — COMPREHENSIVE METABOLIC PANEL
ALT: 21 U/L (ref 0–35)
AST: 24 U/L (ref 0–37)
Albumin: 4.6 g/dL (ref 3.5–5.2)
Alkaline Phosphatase: 50 U/L (ref 39–117)
BUN: 15 mg/dL (ref 6–23)
CO2: 29 mEq/L (ref 19–32)
Calcium: 9.4 mg/dL (ref 8.4–10.5)
Chloride: 95 mEq/L — ABNORMAL LOW (ref 96–112)
Creatinine, Ser: 0.88 mg/dL (ref 0.40–1.20)
GFR: 62.62 mL/min (ref >60.00)
Glucose, Bld: 100 mg/dL — ABNORMAL HIGH (ref 70–99)
Potassium: 4.1 mEq/L (ref 3.5–5.1)
Sodium: 133 mEq/L — ABNORMAL LOW (ref 135–145)
Total Bilirubin: 0.7 mg/dL (ref 0.2–1.2)
Total Protein: 7.4 g/dL (ref 6.0–8.3)

## 2019-11-17 LAB — TSH: TSH: 1.62 u[IU]/mL (ref 0.35–4.50)

## 2019-11-17 LAB — HEMOGLOBIN A1C: Hgb A1c MFr Bld: 6.3 % (ref 4.6–6.5)

## 2019-11-17 LAB — VITAMIN D 25 HYDROXY (VIT D DEFICIENCY, FRACTURES): VITD: 47.83 ng/mL (ref 30.00–100.00)

## 2019-11-20 LAB — HEPATITIS C ANTIBODY
Hepatitis C Ab: NONREACTIVE
SIGNAL TO CUT-OFF: 0.04 (ref <1.00)

## 2019-11-30 LAB — HM COLONOSCOPY

## 2019-12-05 ENCOUNTER — Telehealth: Payer: Self-pay | Admitting: Internal Medicine

## 2019-12-05 NOTE — Telephone Encounter (Signed)
Chelsey Lynch [id number] called from Dr Donnal Moat at [facility] she faxed over lab orders for her on 12/01/2019. Please advise if received and Thank you!  Also can pt labs from 11/17/2019 be faxed to Santiago Glad @ [phone].

## 2019-12-05 NOTE — Telephone Encounter (Signed)
Pt's daughter called and wants clarification about lab orders/referral sent from [facility]?? Please call her back to advise.

## 2019-12-05 NOTE — Telephone Encounter (Signed)
[facility]   [address] [place] [place] [address]   [place], [place], [place] [address]   [phone]   [phone] (Fax)    Call [facility] never recevied labs orders they wanted for patient    Which labs do they need?  Pt daughter calling?   Inform daughter Rob Hickman has not sent orders   [place]

## 2019-12-05 NOTE — Telephone Encounter (Signed)
I am not sure what to advise on this & am unsure of situation. Please advise?

## 2019-12-07 ENCOUNTER — Telehealth: Payer: Self-pay | Admitting: Internal Medicine

## 2019-12-07 NOTE — Telephone Encounter (Signed)
I attempted to reach daughter but her VM was full & unable to leave message.

## 2019-12-07 NOTE — Telephone Encounter (Signed)
Pt's daughter called back about lab orders from [place].

## 2019-12-11 ENCOUNTER — Encounter: Payer: Self-pay | Admitting: Internal Medicine

## 2019-12-11 NOTE — Telephone Encounter (Signed)
It appears after chart review 12/07/19 no further labs needed inform daughter   [place]

## 2019-12-19 HISTORY — PX: OTHER SURGICAL HISTORY: SHX169

## 2019-12-23 MED ORDER — METOPROLOL TARTRATE 25 MG PO TABS
25.00 | ORAL_TABLET | ORAL | Status: DC
Start: 2019-12-23 — End: 2019-12-23

## 2019-12-23 MED ORDER — GENERIC EXTERNAL MEDICATION
40.00 | Status: DC
Start: 2019-12-24 — End: 2019-12-23

## 2019-12-23 MED ORDER — ASPIRIN 81 MG PO TBEC
81.00 | DELAYED_RELEASE_TABLET | ORAL | Status: DC
Start: 2019-12-24 — End: 2019-12-23

## 2019-12-23 MED ORDER — HYDROMORPHONE HCL 1 MG/ML IJ SOLN
0.50 | INTRAMUSCULAR | Status: DC
Start: ? — End: 2019-12-23

## 2019-12-23 MED ORDER — MAGNESIUM HYDROXIDE 400 MG/5ML PO SUSP
15.00 | ORAL | Status: DC
Start: 2019-12-23 — End: 2019-12-23

## 2019-12-23 MED ORDER — CARBOXYMETHYLCELLULOSE SODIUM 0.5 % OP SOLN
1.00 | OPHTHALMIC | Status: DC
Start: ? — End: 2019-12-23

## 2019-12-23 MED ORDER — LIDOCAINE 5 % EX PTCH
2.00 | MEDICATED_PATCH | CUTANEOUS | Status: DC
Start: 2019-12-24 — End: 2019-12-23

## 2019-12-23 MED ORDER — ONDANSETRON HCL 4 MG/2ML IJ SOLN
4.00 | INTRAMUSCULAR | Status: DC
Start: ? — End: 2019-12-23

## 2019-12-23 MED ORDER — SENNOSIDES-DOCUSATE SODIUM 8.6-50 MG PO TABS
2.00 | ORAL_TABLET | ORAL | Status: DC
Start: 2019-12-24 — End: 2019-12-23

## 2019-12-23 MED ORDER — OXYCODONE HCL 5 MG/5ML PO SOLN
2.50 | ORAL | Status: DC
Start: ? — End: 2019-12-23

## 2019-12-23 MED ORDER — ENOXAPARIN SODIUM 40 MG/0.4ML ~~LOC~~ SOLN
40.00 | SUBCUTANEOUS | Status: DC
Start: 2019-12-24 — End: 2019-12-23

## 2019-12-23 MED ORDER — GENERIC EXTERNAL MEDICATION
6.25 | Status: DC
Start: ? — End: 2019-12-23

## 2019-12-25 ENCOUNTER — Encounter: Payer: Self-pay | Admitting: Internal Medicine

## 2019-12-25 ENCOUNTER — Telehealth: Payer: Self-pay | Admitting: Internal Medicine

## 2019-12-25 NOTE — Telephone Encounter (Signed)
Called patient's daughter. She states [facility] wants her to have physical and occupational therapy post surgery.  Daughter states that it could be done through [facility] but it would hard to get her there.   Requesting referral for home health for wound care, bathing, physical therapy, and occupational therapy.   Please advise

## 2019-12-25 NOTE — Telephone Encounter (Signed)
Call daughter  Rob Hickman can order for her mother for [facility] PT/OT outpatient  Or For Home health PT/OT, wound care nurse, nurse aid, nurse for bathing  -[facility] can order all of this   Otherwise she will need an appt for me to get these services approved   Have daughter call [place] doctors back and ask them to order  (all of the above) this since they are requesting this service

## 2019-12-25 NOTE — Telephone Encounter (Signed)
Pt's daughter is asking for Dr. Audrie Gallus nurse to give her a call regarding her mom's surgery. She didn't go into detail.

## 2019-12-26 NOTE — Telephone Encounter (Signed)
See patient message encounter created 12/25/19.

## 2019-12-26 NOTE — Telephone Encounter (Signed)
Patient's daughter wants to know if the visit can be virtual. States her mother is in a lot of pain to try and come in. Prefers afternoon appointment.  Please advise    Spoke with Dr Olivia Mackie and she states she will see the patient virtually. Will schedule patient for a doxy, 12/27/18 at 1:00pm.

## 2019-12-28 ENCOUNTER — Encounter: Payer: Self-pay | Admitting: Internal Medicine

## 2019-12-28 ENCOUNTER — Other Ambulatory Visit: Payer: Self-pay

## 2019-12-28 ENCOUNTER — Telehealth (INDEPENDENT_AMBULATORY_CARE_PROVIDER_SITE_OTHER): Payer: Medicare Other | Admitting: Internal Medicine

## 2019-12-28 VITALS — BP 116/71 | Ht 62.0 in | Wt 123.0 lb

## 2019-12-28 DIAGNOSIS — R1013 Epigastric pain: Secondary | ICD-10-CM | POA: Diagnosis not present

## 2019-12-28 DIAGNOSIS — C49A0 Gastrointestinal stromal tumor, unspecified site: Secondary | ICD-10-CM

## 2019-12-28 DIAGNOSIS — R339 Retention of urine, unspecified: Secondary | ICD-10-CM

## 2019-12-28 DIAGNOSIS — Z7409 Other reduced mobility: Secondary | ICD-10-CM | POA: Diagnosis not present

## 2019-12-28 DIAGNOSIS — R5381 Other malaise: Secondary | ICD-10-CM | POA: Diagnosis not present

## 2019-12-28 DIAGNOSIS — Z789 Other specified health status: Secondary | ICD-10-CM

## 2019-12-28 DIAGNOSIS — I10 Essential (primary) hypertension: Secondary | ICD-10-CM

## 2019-12-28 MED ORDER — OXYCODONE HCL 5 MG PO TABS: 5.0000 mg | ORAL_TABLET | Freq: Two times a day (BID) | ORAL | 0 refills | Status: DC | PRN

## 2019-12-28 MED ORDER — AMOXICILLIN 500 MG PO TABS: 500.0000 mg | ORAL_TABLET | Freq: Two times a day (BID) | ORAL | Status: DC

## 2019-12-28 NOTE — Addendum Note (Signed)
Addended by: Thressa Sheller on: 12/28/2019 01:56 PM   Modules accepted: Orders

## 2019-12-28 NOTE — Progress Notes (Signed)
telephone Note  I connected with Chelsey Lynch   on 12/28/19 at  1:20 PM EST by telephone and verified that I am speaking with the correct person using two identifiers.  Location patient: home Location provider:work or home office Persons participating in the virtual visit: patient, provider  I discussed the limitations of evaluation and management by telemedicine and the availability of in person appointments. The patient expressed understanding and agreed to proceed.   HPI: 1. 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 Wound, nurse aid and nursing for meds at home and unable to go to [facility] to get these services daughter reports appt 01/05/20 at [facility]. She continues to take Abx Augmentin    ROS: See pertinent positives and negatives per HPI.  Past Medical History:  Diagnosis Date  . Arthritis    left shoulder, right low back and right hip   . Hyperlipidemia   . Hypertension   . UTI (urinary tract infection)     Past Surgical History:  Procedure Laterality Date  . CATARACT EXTRACTION     b/l     Family History  Problem Relation Age of Onset  . Breast cancer Other     SOCIAL HX:  4 daughters  Moved here from [place], [place].  Currently not working and pending insurance as of 06/30/19  [place]    Current Outpatient Medications:  .  amLODipine (NORVASC) 2.5 MG tablet, Take 1 tablet (2.5 mg total) by mouth daily. If BP>130/>80, Disp: 90 tablet, Rfl: 3 .  aspirin EC 81 MG tablet, Take 81 mg by mouth daily., Disp: , Rfl:  .  lisinopril-hydrochlorothiazide (ZESTORETIC) 20-25 MG tablet, Take 1 tablet by mouth daily., Disp: 90 tablet, Rfl: 3 .  metoprolol tartrate (LOPRESSOR) 50 MG tablet, Take 1 tablet (50 mg total) by mouth 2 (two) times daily., Disp: 180 tablet, Rfl: 3 .  pantoprazole (PROTONIX) 40 MG tablet, Take by mouth., Disp: , Rfl:  .  simvastatin (ZOCOR) 20 MG  tablet, Take 1 tablet (20 mg total) by mouth daily at 6 PM., Disp: 90 tablet, Rfl: 3 .  Ascorbic Acid (VITAMIN C PO), Take by mouth daily., Disp: , Rfl:  .  Cholecalciferol 25 MCG (1000 UT) capsule, Take 1,000 Units by mouth daily., Disp: , Rfl:  .  GLUTAMINE PO, Take by mouth daily., Disp: , Rfl:  .  multivitamin-lutein (OCUVITE-LUTEIN) CAPS capsule, Take 1 capsule by mouth daily., Disp: , Rfl:  .  Omega-3 Fatty Acids (OMEGA 3 PO), Take by mouth daily., Disp: , Rfl:  .  oxyCODONE (OXY IR/ROXICODONE) 5 MG immediate release tablet, Take 1 tablet (5 mg total) by mouth 2 (two) times daily as needed for severe pain., Disp: 20 tablet, Rfl: 0  EXAM:  VITALS per patient if applicable:  GENERAL: alert, oriented, appears well and in no acute distress  PSYCH/NEURO: pleasant and cooperative, no obvious depression or anxiety, speech and thought processing grossly intact  ASSESSMENT AND PLAN:  Discussed the following assessment and plan:  Epigastric pain s/p GIST tumor spindle removal 2/16-2/19/21- Plan: oxyCODONE (OXY IR/ROXICODONE) 5 MG immediate release tablet bid prn, Ambulatory referral to [facility], OT, RN, RN aid, wound care    Physical deconditioning - Plan: Ambulatory referral to [facility] Impaired mobility and ADLs - Plan: Ambulatory referral to [place] Alteration in instrumental activities of daily living (IADL) - Plan: Ambulatory referral to [place]  Urinary retention could be s/p surgery or narcotics  rec call [facility] surgery back asap  May have to go to the ED for cath  If H/H care can I/o cath can also do this   HM Flu shot utd rec Tdap, shingrix, prevnar if not had  She does report she had pna 23 vaccine 04/30/19   mammo referred  Consider cologuard when she gets insurance  Out of age window pap  Consider DEXA in future  Will need referral to eye MD when gets insurance right eye bothersome to her   Former PCP Dr. Marguerita Beards in [place] try to get records -we  discussed possible serious and likely etiologies, options for evaluation and workup, limitations of telemedicine visit vs in person visit, treatment, treatment risks and precautions. Pt prefers to treat via telemedicine empirically rather then risking or undertaking an in person visit at this moment. Patient agrees to seek prompt in person care if worsening, new symptoms arise, or if is not improving with treatment.   I discussed the assessment and treatment plan with the patient. The patient was provided an opportunity to ask questions and all were answered. The patient agreed with the plan and demonstrated an understanding of the instructions.   The patient was advised to call back or seek an in-person evaluation if the symptoms worsen or if the condition fails to improve as anticipated.  Time spent 20 minutes  [name], MD

## 2019-12-29 ENCOUNTER — Telehealth: Payer: Self-pay | Admitting: Internal Medicine

## 2019-12-29 ENCOUNTER — Encounter: Payer: Self-pay | Admitting: Internal Medicine

## 2019-12-29 NOTE — Telephone Encounter (Signed)
Referral placed to [facility] 12/29/19  [place]

## 2019-12-29 NOTE — Progress Notes (Signed)
Good afternoon!  I did speak with daughter about pt insurance.

## 2019-12-29 NOTE — Telephone Encounter (Signed)
Pt's daughter called and was crying. She states that pt needs rehab or home healthcare because she can not take care of her on her own. She is having to get up several times a night to check on her. She is frustrated with [facility] and does not understand why they did not assign someone to help her. She asked to speak to Dr [facility] asap. Please advise. Thanks!

## 2019-12-29 NOTE — Telephone Encounter (Signed)
This referral for home health was placed as urgent yesterday. Can see that Ras[facility] tried to reach out to the patient also yesterday. Is there anything else we are needed to do?   Please advise and thank you

## 2020-01-01 NOTE — Telephone Encounter (Signed)
Pt's daughter is wanting a call back from Dr. Audrie Gallus nurse.

## 2020-01-02 NOTE — Telephone Encounter (Signed)
Patient's daughter calling back. Informed her of referral being placed to [facility] and all forms need from our end being faxed 2/26 as per referral note.  [name] verbalized understanding. States that no one has reached out to her and her mom. Gave her the number to [facility] [place] office to call and initiate set up.

## 2020-01-02 NOTE — Telephone Encounter (Signed)
No answer, no voicemail.

## 2020-01-17 ENCOUNTER — Telehealth: Payer: Self-pay | Admitting: Internal Medicine

## 2020-01-17 NOTE — Telephone Encounter (Signed)
Pt daughter called wanting to speak to you.  Didn't go into detail

## 2020-01-26 ENCOUNTER — Encounter: Payer: Self-pay | Admitting: Oncology

## 2020-01-26 ENCOUNTER — Telehealth: Payer: Self-pay | Admitting: Internal Medicine

## 2020-01-26 ENCOUNTER — Inpatient Hospital Stay: Payer: Medicare Other

## 2020-01-26 ENCOUNTER — Inpatient Hospital Stay: Payer: Medicare Other | Attending: Oncology | Admitting: Oncology

## 2020-01-26 VITALS — BP 102/45 | HR 64 | Temp 98.2°F | Resp 16 | Wt 130.9 lb

## 2020-01-26 DIAGNOSIS — C49A0 Gastrointestinal stromal tumor, unspecified site: Secondary | ICD-10-CM

## 2020-01-26 DIAGNOSIS — C49A2 Gastrointestinal stromal tumor of stomach: Secondary | ICD-10-CM | POA: Diagnosis not present

## 2020-01-26 DIAGNOSIS — M1990 Unspecified osteoarthritis, unspecified site: Secondary | ICD-10-CM | POA: Diagnosis not present

## 2020-01-26 DIAGNOSIS — Z7189 Other specified counseling: Secondary | ICD-10-CM

## 2020-01-26 DIAGNOSIS — D649 Anemia, unspecified: Secondary | ICD-10-CM | POA: Diagnosis not present

## 2020-01-26 DIAGNOSIS — I10 Essential (primary) hypertension: Secondary | ICD-10-CM

## 2020-01-26 DIAGNOSIS — R5383 Other fatigue: Secondary | ICD-10-CM | POA: Diagnosis not present

## 2020-01-26 DIAGNOSIS — Z79899 Other long term (current) drug therapy: Secondary | ICD-10-CM | POA: Diagnosis not present

## 2020-01-26 DIAGNOSIS — E785 Hyperlipidemia, unspecified: Secondary | ICD-10-CM

## 2020-01-26 DIAGNOSIS — K219 Gastro-esophageal reflux disease without esophagitis: Secondary | ICD-10-CM

## 2020-01-26 LAB — IRON AND TIBC
Iron: 65 ug/dL (ref 28–170)
Saturation Ratios: 21 % (ref 10.4–31.8)
TIBC: 312 ug/dL (ref 250–450)
UIBC: 247 ug/dL

## 2020-01-26 LAB — COMPREHENSIVE METABOLIC PANEL
ALT: 31 U/L (ref 0–44)
AST: 23 U/L (ref 15–41)
Albumin: 4 g/dL (ref 3.5–5.0)
Alkaline Phosphatase: 49 U/L (ref 38–126)
Anion gap: 9 (ref 5–15)
BUN: 20 mg/dL (ref 8–23)
CO2: 28 mmol/L (ref 22–32)
Calcium: 9.3 mg/dL (ref 8.9–10.3)
Chloride: 95 mmol/L — ABNORMAL LOW (ref 98–111)
Creatinine, Ser: 0.79 mg/dL (ref 0.44–1.00)
GFR calc Af Amer: >60 mL/min (ref >60)
GFR calc non Af Amer: >60 mL/min (ref >60)
Glucose, Bld: 102 mg/dL — ABNORMAL HIGH (ref 70–99)
Potassium: 4.1 mmol/L (ref 3.5–5.1)
Sodium: 132 mmol/L — ABNORMAL LOW (ref 135–145)
Total Bilirubin: 0.7 mg/dL (ref 0.3–1.2)
Total Protein: 7.1 g/dL (ref 6.5–8.1)

## 2020-01-26 LAB — CBC WITH DIFFERENTIAL/PLATELET
Abs Immature Granulocytes: 0.02 10*3/uL (ref 0.00–0.07)
Basophils Absolute: 0 10*3/uL (ref 0.0–0.1)
Basophils Relative: 1 %
Eosinophils Absolute: 0.2 10*3/uL (ref 0.0–0.5)
Eosinophils Relative: 5 %
HCT: 33.3 % — ABNORMAL LOW (ref 36.0–46.0)
Hemoglobin: 11.3 g/dL — ABNORMAL LOW (ref 12.0–15.0)
Immature Granulocytes: 0 %
Lymphocytes Relative: 28 %
Lymphs Abs: 1.4 10*3/uL (ref 0.7–4.0)
MCH: 30.2 pg (ref 26.0–34.0)
MCHC: 33.9 g/dL (ref 30.0–36.0)
MCV: 89 fL (ref 80.0–100.0)
Monocytes Absolute: 0.5 10*3/uL (ref 0.1–1.0)
Monocytes Relative: 10 %
Neutro Abs: 2.8 10*3/uL (ref 1.7–7.7)
Neutrophils Relative %: 56 %
Platelets: 236 10*3/uL (ref 150–400)
RBC: 3.74 MIL/uL — ABNORMAL LOW (ref 3.87–5.11)
RDW: 12.8 % (ref 11.5–15.5)
WBC: 5 10*3/uL (ref 4.0–10.5)
nRBC: 0 % (ref 0.0–0.2)

## 2020-01-26 LAB — FERRITIN: Ferritin: 200 ng/mL (ref 11–307)

## 2020-01-26 NOTE — Telephone Encounter (Signed)
I left a message for pt daughter to call me regarding home health referral.

## 2020-01-26 NOTE — Progress Notes (Signed)
Hematology/Oncology Consult note [facility] [phone] [phone]   Patient Care Team: McLean-Scocuzza, Nino Glow, MD as PCP - General (Internal Medicine)  REFERRING PROVIDER: McLean-Scocuzza, Olivia Mackie *  CHIEF COMPLAINTS/REASON FOR VISIT:  Evaluation of GIST  HISTORY OF PRESENTING ILLNESS:   Chelsey Lynch is a  74 y.o.  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  Just was favored.  Patient was referred to [facility] Dr. Donnal Moat who recommended EUS with biopsy. 11/24/2019, patient underwent EUS biopsy. Pathology showed gastrointestinal stromal tumor, spindle cell type Patient also underwent colonoscopy on 11/30/2019.  Status post 1 colon polyp status resection.  Pathology showed tubular adenoma.  Left colon endoscopic biopsy showed chronic mucosa with ischemic pattern of 12/19/2019 patient underwent wedge gastrectomy Pathology showed GIST, spindle cell type, 3.2 cm, histology grade 2, high-grade, mitotic rate 11/70m2, Unifocal, margins were negative.  No lymph nodes were submitted.  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  Patient prefers to establish care locally for discussion of adjuvant treatment. Patient lost weight after surgery, appetite has improved.   also has experienced postsurgical abdominal discomfort at the lower abdomen which is improving.  Patient wears abdominal binder routinely.  Currently she is not taking oxycodone. Patient walks independently at home.  [place]ives [place] in [place]  Review of Systems  Constitutional: Positive for fatigue.  Respiratory: Negative for cough and shortness of breath.   Cardiovascular: Negative for chest pain.  Gastrointestinal: Negative  for abdominal distention.       Low abdominal discomfort  Skin: Negative for rash.  Neurological: Negative for headaches.  Hematological: Negative for adenopathy.  Psychiatric/Behavioral: Negative for confusion.    MEDICAL HISTORY:  Past Medical History:  Diagnosis Date  . Arthritis    left shoulder, right low back and right hip   . Hyperlipidemia   . Hypertension   . UTI (urinary tract infection)     SURGICAL HISTORY: Past Surgical History:  Procedure Laterality Date  . CATARACT EXTRACTION     b/l   . Gastrointestinal stromal tumor (GIST) of stomach  12/19/2019    SOCIAL HISTORY: Social History   Socioeconomic History  . Marital status: Divorced    Spouse name: Not on file  . Number of children: Not on file  . Years of education: Not on file  . Highest education level: Not on file  Occupational History  . Not on file  Tobacco Use  . Smoking status: Never Smoker  . Smokeless tobacco: Never Used  Substance and Sexual Activity  . Alcohol use: Not Currently  . Drug use: Not Currently  . Sexual activity: Not Currently  Other Topics Concern  . Not on file  Social History Narrative   4 daughters    [place]    Currently not working and pending insurance as of 06/30/19    [place]   Social Determinants of Health   Financial Resource Strain:   . Difficulty of Paying Living Expenses:   Food Insecurity:   . Worried About RCharity fundraiserin the Last Year:   . RArboriculturistin the Last Year:   Transportation Needs:   . LFilm/video editor(Medical):   .Marland Kitchen  Lack of Transportation (Non-Medical):   Physical Activity:   . Days of Exercise per Week:   . Minutes of Exercise per Session:   Stress:   . Feeling of Stress :   Social Connections:   . Frequency of Communication with Friends and Family:   . Frequency of Social Gatherings with Friends and Family:   . Attends Religious Services:   . Active Member of Clubs or  Organizations:   . Attends Archivist Meetings:   Marland Kitchen Marital Status:   Intimate Partner Violence:   . Fear of Current or Ex-Partner:   . Emotionally Abused:   Marland Kitchen Physically Abused:   . Sexually Abused:     FAMILY HISTORY: Family History  Problem Relation Age of Onset  . Breast cancer Other     ALLERGIES:  is allergic to acetaminophen and tylenol with codeine #3  [acetaminophen-codeine].  MEDICATIONS:  Current Outpatient Medications  Medication Sig Dispense Refill  . Ascorbic Acid (VITAMIN C PO) Take by mouth daily.    Marland Kitchen aspirin EC 81 MG tablet Take 81 mg by mouth daily.    . Cholecalciferol 25 MCG (1000 UT) capsule Take 1,000 Units by mouth daily.    Marland Kitchen lisinopril-hydrochlorothiazide (ZESTORETIC) 20-25 MG tablet Take 1 tablet by mouth daily. 90 tablet 3  . metoprolol tartrate (LOPRESSOR) 50 MG tablet Take 1 tablet (50 mg total) by mouth 2 (two) times daily. 180 tablet 3  . multivitamin-lutein (OCUVITE-LUTEIN) CAPS capsule Take 1 capsule by mouth daily.    . Omega-3 Fatty Acids (OMEGA 3 PO) Take by mouth daily.    . simvastatin (ZOCOR) 20 MG tablet Take 1 tablet (20 mg total) by mouth daily at 6 PM. 90 tablet 3  . amLODipine (NORVASC) 2.5 MG tablet Take 1 tablet (2.5 mg total) by mouth daily. If BP>130/>80 (Patient not taking: Reported on 01/26/2020) 90 tablet 3  . amoxicillin (AMOXIL) 500 MG tablet Take 1 tablet (500 mg total) by mouth 2 (two) times daily. (Patient not taking: Reported on 01/26/2020)    . GLUTAMINE PO Take by mouth daily.    Marland Kitchen oxyCODONE (OXY IR/ROXICODONE) 5 MG immediate release tablet Take 1 tablet (5 mg total) by mouth 2 (two) times daily as needed for severe pain. (Patient not taking: Reported on 01/26/2020) 20 tablet 0  . pantoprazole (PROTONIX) 40 MG tablet Take by mouth.     No current facility-administered medications for this visit.     PHYSICAL EXAMINATION: ECOG PERFORMANCE STATUS: 2 - Symptomatic, <50% confined to bed Vitals:   01/26/20 1522    BP: (!) 102/45  Pulse: 64  Resp: 16  Temp: 98.2 F (36.8 C)   Filed Weights   01/26/20 1522  Weight: 130 lb 14.4 oz (59.4 kg)    Physical Exam Constitutional:      General: She is not in acute distress.    Comments: Patient sits in a wheelchair  HENT:     Head: Normocephalic and atraumatic.  Eyes:     General: No scleral icterus. Cardiovascular:     Rate and Rhythm: Normal rate and regular rhythm.     Heart sounds: Normal heart sounds.  Pulmonary:     Effort: Pulmonary effort is normal. No respiratory distress.     Breath sounds: No wheezing.  Abdominal:     General: Bowel sounds are normal. There is no distension.     Palpations: Abdomen is soft.  Musculoskeletal:        General: No deformity. Normal range  of motion.     Cervical back: Normal range of motion and neck supple.  Skin:    General: Skin is warm and dry.     Findings: No erythema or rash.  Neurological:     Mental Status: She is alert and oriented to person, place, and time. Mental status is at baseline.     Cranial Nerves: No cranial nerve deficit.     Coordination: Coordination normal.  Psychiatric:        Mood and Affect: Mood normal.     LABORATORY DATA:  I have reviewed the data as listed Lab Results  Component Value Date   WBC 5.0 01/26/2020   HGB 11.3 (L) 01/26/2020   HCT 33.3 (L) 01/26/2020   MCV 89.0 01/26/2020   PLT 236 01/26/2020   Recent Labs    08/18/19 0816 08/18/19 0816 08/18/19 1500 11/17/19 0844 01/26/20 1610  NA 127*   < > 132* 133* 132*  K 4.0   < > 3.9 4.1 4.1  CL 89*   < > 96* 95* 95*  CO2 27   < > '28 29 28  '$ GLUCOSE 114*   < > 97 100* 102*  BUN 16   < > '12 15 20  '$ CREATININE 0.82   < > 0.82 0.88 0.79  CALCIUM 9.1   < > 9.2 9.4 9.3  GFRNONAA >60  --  >60  --  >60  GFRAA >60  --  >60  --  >60  PROT 7.3  --   --  7.4 7.1  ALBUMIN 4.2  --   --  4.6 4.0  AST 26  --   --  24 23  ALT 23  --   --  21 31  ALKPHOS 53  --   --  50 49  BILITOT 0.7  --   --  0.7 0.7   <  > = values in this interval not displayed.   Iron/TIBC/Ferritin/ %Sat    Component Value Date/Time   IRON 65 01/26/2020 1610   TIBC 312 01/26/2020 1610   FERRITIN 200 01/26/2020 1610   IRONPCTSAT 21 01/26/2020 1610      RADIOGRAPHIC STUDIES: I have personally reviewed the radiological images as listed and agreed with the findings in the report. No results found.    ASSESSMENT & PLAN:  1. Gastrointestinal stromal tumor (GIST) ([place])   2. Goals of care, counseling/discussion   3. Normocytic anemia    Pathology was reviewed and discussed with patient daughter. GIST of stomach, status post resection -Tumor size 3.2 cm, high mitotic rate 11/41m2, rare exon 13 Mutation. Discussed with patient and daughter that met eligible criteria for adjuvant imatinib treatments for 3 years. Clinical study has demonstrated improved overall survival and reduce recurrence. Recommend imatinib 400 mg daily, if tolerated, duration of treatment will be 3 years, to decrease her recurrence rate and improve overall survival. The goal of treatment is with curative intent.  Chemotherapy education was provided.    I explained to the patient the risks and benefits of imatinib including all but not limited to hair loss, decreased blood counts, cardiovascular events, fluid retention, rash, gastroenterology toxicities, bleeding, infection, liver toxicity, neurological toxicities, blurred vision, kidney toxicities, etc.  . Patient voices understanding and willing to proceed chemotherapy.  She will also need to have surveillance images, recommend next image around 3 months after surgery, May 2021  Supportive care measures are necessary for patient well-being and will be provided as necessary.  Anemia, normocytic,  check iron panel  We spent sufficient time to discuss many aspect of care, questions were answered to patient's satisfaction.   Orders Placed This Encounter  Procedures  . CBC with Differential/Platelet     Standing Status:   Future    Number of Occurrences:   1    Standing Expiration Date:   01/25/2021  . Comprehensive metabolic panel    Standing Status:   Future    Number of Occurrences:   1    Standing Expiration Date:   01/25/2021  . Ferritin    Standing Status:   Future    Number of Occurrences:   1    Standing Expiration Date:   01/25/2021  . Iron and TIBC    Standing Status:   Future    Number of Occurrences:   1    Standing Expiration Date:   01/25/2021    All questions were answered. The patient knows to call the clinic with any problems questions or concerns.  cc McLean-Scocuzza, Olivia Mackie *    Return of visit: To be determined Thank you for this kind referral and the opportunity to participate in the care of this patient. A copy of today's note is routed to referring provider    Earlie Server, MD, PhD Hematology Oncology [facility] Pager- [phone] 01/26/2020

## 2020-01-26 NOTE — Progress Notes (Signed)
Patient is transitioning her care from [facility] to [facility] for convenience.

## 2020-01-29 ENCOUNTER — Telehealth: Payer: Self-pay | Admitting: Pharmacist

## 2020-01-29 ENCOUNTER — Other Ambulatory Visit: Payer: Self-pay | Admitting: Oncology

## 2020-01-29 DIAGNOSIS — Z7189 Other specified counseling: Secondary | ICD-10-CM | POA: Insufficient documentation

## 2020-01-29 MED ORDER — IMATINIB MESYLATE 400 MG PO TABS: 400.0000 mg | ORAL_TABLET | Freq: Every day | ORAL | 1 refills | Status: DC

## 2020-01-29 NOTE — Telephone Encounter (Signed)
Pt daughter called they are not going with [facility] health because they don't offer transportation or PT and that's what they are needing

## 2020-01-29 NOTE — Telephone Encounter (Signed)
Oral Oncology Pharmacist Encounter  Received new prescription for Gleevec (imatinib) for the adjuvant treatment of GIST, planned duration 3 years if tolerated.  CMP from 01/26/20 assessed, no relevant lab abnormalities. Prescription dose and frequency assessed.   Current medication list in Epic reviewed, a few DDIs with imatinib identified: - Imatinib may increasethe serum concentration of amlodipine, oxycodone, and simvastatin. Monitor patient for increased adverse events related to the listed medications. No baseline dose adjustment needed.   Prescription has been e-scribed to the [facility] for benefits analysis and approval.  Oral Oncology Clinic will continue to follow for insurance authorization, copayment issues, initial counseling and start date.  Darl Pikes, PharmD, BCPS, BCOP, CPP Hematology/Oncology Clinical Pharmacist [facility]/AP [facility] [phone]  01/29/2020 10:01 AM

## 2020-01-29 NOTE — Progress Notes (Signed)
START OFF PATHWAY REGIMEN - Other   [id number]:Imatinib 400 mg PO daily D1-28 q28 Days:   A cycle is every 28 days:     Imatinib   **Always confirm dose/schedule in your pharmacy ordering system**  Patient Characteristics: Intent of Therapy: Curative Intent, Discussed with Patient

## 2020-01-30 ENCOUNTER — Telehealth: Payer: Self-pay

## 2020-01-30 ENCOUNTER — Other Ambulatory Visit: Payer: Self-pay | Admitting: Pharmacist

## 2020-01-30 DIAGNOSIS — C49A0 Gastrointestinal stromal tumor, unspecified site: Secondary | ICD-10-CM

## 2020-01-30 MED ORDER — IMATINIB MESYLATE 400 MG PO TABS: 400.0000 mg | ORAL_TABLET | Freq: Every day | ORAL | 1 refills | Status: DC

## 2020-01-30 NOTE — Telephone Encounter (Signed)
Oral Oncology Patient Advocate Encounter  After completing a benefits investigation, prior authorization for Gleevec is not required at this time through [facility].  Patient's copay is $3..    [place] Patient [place] Phone [phone] Fax [phone] 01/30/2020 10:56 AM

## 2020-02-02 MED FILL — IMATINIB MESYLATE 400 MG TA: 400 | 30 days supply | Qty: 30 | Fill #0

## 2020-02-05 ENCOUNTER — Other Ambulatory Visit: Payer: Self-pay

## 2020-02-05 ENCOUNTER — Telehealth: Payer: Self-pay

## 2020-02-05 DIAGNOSIS — C49A0 Gastrointestinal stromal tumor, unspecified site: Secondary | ICD-10-CM

## 2020-02-05 NOTE — Telephone Encounter (Signed)
Chelsey Lynch please schedule patient for lab/MD one day next week and notify pt's daughter. Thank you.

## 2020-02-05 NOTE — Telephone Encounter (Signed)
-----   Message from Earlie Server, MD sent at 02/05/2020  8:33 AM EDT ----- Please arrange patient to see me next week. Lab MD cbc cmp thanks ----- Message ----- From: Darl Pikes, RPH-CPP Sent: 02/02/2020  10:35 AM EDT To: Sheliah Hatch, CPhT, Evelina Dun, RN, #  Good morning! The patient's daughter, [name], returned my call. Her mom's medication will be delivered on Monday 4/5. She know's her mom is to get started when she receives the medication.  Clearnce Sorrel  ----- Message ----- From: Earlie Server, MD Sent: 01/26/2020  11:33 PM EDT To: Sheliah Hatch, CPhT, Evelina Dun, RN, #  Teryl Lucy,  GIST, recommend her to start adjuvant Imatinib 400mg  daily. Please provide chemotherapy education. Please let me know when she gets medication, recommend her to start and I will see her 1 week after on medication. Thanks.   Talbert Cage

## 2020-02-08 ENCOUNTER — Other Ambulatory Visit: Payer: Self-pay | Admitting: Oncology

## 2020-02-08 ENCOUNTER — Telehealth: Payer: Self-pay

## 2020-02-08 DIAGNOSIS — C49A0 Gastrointestinal stromal tumor, unspecified site: Secondary | ICD-10-CM

## 2020-02-08 NOTE — Telephone Encounter (Signed)
Per [place] message from [place] [facility]: Pt daughter called and wanted her mother's appt canceled for 4/16. She stated her mother does not want to take the medication given to her by [facility]. Her daughter [name] stated they will be bring the medication back to campus.

## 2020-02-08 NOTE — Telephone Encounter (Signed)
Per Dr. Tasia Catchings: cancel appts on 4/16.  Pt to have CT scan 3 months from surgery (early June) and see MD a few days after. I have contacted daughter and notified her. Please inform her of appt details. thank you  [name] also notified that she may bring medication to cancer center and pharmacy will discard.

## 2020-02-08 NOTE — Telephone Encounter (Signed)
DONE.Marland KitchenMarland Kitchen CT Scan and MD F/u appts has been scheduled as requested per Dr.Yu Called and made pt's daughter [name]) aware of the sched appts location, dates and times.

## 2020-02-09 ENCOUNTER — Telehealth: Payer: Self-pay | Admitting: Oncology

## 2020-02-09 NOTE — Telephone Encounter (Signed)
Patient's daughter phoned and stated that she rescheduled patient's CT and needed MD follow up moved as well. Appt rescheduled to be after CT.

## 2020-02-15 ENCOUNTER — Ambulatory Visit: Payer: Medicaid Other | Admitting: Oncology

## 2020-02-15 ENCOUNTER — Other Ambulatory Visit: Payer: Medicaid Other

## 2020-02-16 ENCOUNTER — Other Ambulatory Visit: Payer: Medicaid Other

## 2020-02-16 ENCOUNTER — Ambulatory Visit: Payer: Medicaid Other | Admitting: Oncology

## 2020-03-12 ENCOUNTER — Other Ambulatory Visit: Payer: Self-pay

## 2020-03-12 ENCOUNTER — Ambulatory Visit (INDEPENDENT_AMBULATORY_CARE_PROVIDER_SITE_OTHER): Payer: Medicare Other | Admitting: Internal Medicine

## 2020-03-12 ENCOUNTER — Encounter: Payer: Self-pay | Admitting: Internal Medicine

## 2020-03-12 VITALS — BP 122/74 | HR 66 | Temp 97.6°F | Ht 62.0 in | Wt 129.0 lb

## 2020-03-12 DIAGNOSIS — H9203 Otalgia, bilateral: Secondary | ICD-10-CM | POA: Diagnosis not present

## 2020-03-12 DIAGNOSIS — R1013 Epigastric pain: Secondary | ICD-10-CM

## 2020-03-12 DIAGNOSIS — R7303 Prediabetes: Secondary | ICD-10-CM

## 2020-03-12 DIAGNOSIS — E2839 Other primary ovarian failure: Secondary | ICD-10-CM

## 2020-03-12 DIAGNOSIS — I10 Essential (primary) hypertension: Secondary | ICD-10-CM

## 2020-03-12 DIAGNOSIS — C49A0 Gastrointestinal stromal tumor, unspecified site: Secondary | ICD-10-CM

## 2020-03-12 MED ORDER — METOPROLOL TARTRATE 50 MG PO TABS: 50.0000 mg | ORAL_TABLET | Freq: Two times a day (BID) | ORAL | 3 refills | Status: DC

## 2020-03-12 MED ORDER — NEOMYCIN-POLYMYXIN-HC 3.5-10000-1 OT SOLN: 4.0000 [drp] | Freq: Three times a day (TID) | OTIC | 0 refills | Status: DC

## 2020-03-12 NOTE — Progress Notes (Signed)
Chief Complaint  Patient presents with  . Follow-up   F/u alone there is language barrier today  1. BP normal today on lis-hct 20-25 mg qd and lopressor 50 mg bid with h/o hyponatremia Na as low as 128 01/05/20 we disc possibly d/c hctz today  2. 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 Tasia Catchings and [facility]/H/o rec Q3 months. She is supposed to be on Gleevec 400 mg qd but she reports she is not taking this today  Distance to [facility] for appts is far and will stay locally Dr. Tasia Catchings and I disc today establishing local GI as well and periodically checking in with [facility]  3. Cerumen impaction b/l ears today with pain will clean see A&P 4.h/o prediabetes will check labs again today  Review of Systems  Constitutional: Negative for weight loss.  HENT: Positive for ear pain. Negative for hearing loss.   Eyes: Negative for blurred vision.  Respiratory: Negative for shortness of breath.   Cardiovascular: Negative for chest pain.  Gastrointestinal: Positive for abdominal pain. Negative for nausea and vomiting.  Musculoskeletal: Negative for falls.  Skin: Negative for rash.  Neurological: Negative for headaches.  Psychiatric/Behavioral: Negative for depression.   Past Medical History:  Diagnosis Date  . Arthritis    left shoulder, right low back and right hip   . Hyperlipidemia   . Hypertension   . UTI (urinary tract infection)    Past Surgical History:  Procedure Laterality Date  . CATARACT EXTRACTION     b/l   . 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 Breast cancer Other    Social History   Socioeconomic History  . Marital status: Divorced    Spouse name: Not on file  . Number of children: Not on file  . Years of education: Not on file  . Highest education level: Not on file  Occupational History  . Not on file  Tobacco Use  . Smoking  status: Never Smoker  . Smokeless tobacco: Never Used  Substance and Sexual Activity  . Alcohol use: Not Currently  . Drug use: Not Currently  . Sexual activity: Not Currently  Other Topics Concern  . Not on file  Social History Narrative   4 daughters    [place] here from [place], [place].    Currently not working and pending insurance as of 06/30/19    From the [employer]    Social Determinants of Health   Financial Resource Strain:   . Difficulty of Paying Living Expenses:   Food Insecurity:   . Worried About Charity fundraiser in the Last Year:   . Arboriculturist in the Last Year:   Transportation Needs:   . Film/video editor (Medical):   Marland Kitchen Lack of Transportation (Non-Medical):   Physical Activity:   . Days of Exercise per Week:   . Minutes of Exercise per Session:   Stress:   . Feeling of Stress :   Social Connections:   . Frequency of Communication with Friends and Family:   . Frequency of Social Gatherings with Friends and Family:   . Attends Religious Services:   . Active Member of Clubs or Organizations:   . Attends Archivist Meetings:   Marland Kitchen Marital Status:   Intimate Partner Violence:   . Fear of Current or Ex-Partner:   .  Emotionally Abused:   Marland Kitchen Physically Abused:   . Sexually Abused:    Current Meds  Medication Sig  . amLODipine (NORVASC) 2.5 MG tablet Take 1 tablet (2.5 mg total) by mouth daily. If BP>130/>80  . Ascorbic Acid (VITAMIN C PO) Take by mouth daily.  Marland Kitchen aspirin EC 81 MG tablet Take 81 mg by mouth daily.  . B Complex-C (B-COMPLEX WITH VITAMIN C) tablet Take 1 tablet by mouth daily.  . Cholecalciferol (VITAMIN D3) 50 MCG (2000 UT) capsule Take 2,000 Units by mouth daily.  Marland Kitchen lisinopril-hydrochlorothiazide (ZESTORETIC) 20-25 MG tablet Take 1 tablet by mouth daily.  . metoprolol tartrate (LOPRESSOR) 50 MG tablet Take 1 tablet (50 mg total) by mouth 2 (two) times daily.  . multivitamin-lutein (OCUVITE-LUTEIN) CAPS capsule Take 1 capsule by  mouth daily.  . Omega-3 Fatty Acids (OMEGA 3 PO) Take by mouth daily.  . pantoprazole (PROTONIX) 40 MG tablet Take by mouth.  . simvastatin (ZOCOR) 20 MG tablet Take 1 tablet (20 mg total) by mouth daily at 6 PM.  . [DISCONTINUED] Cholecalciferol 25 MCG (1000 UT) capsule Take 1,000 Units by mouth daily.  . [DISCONTINUED] metoprolol tartrate (LOPRESSOR) 50 MG tablet Take 1 tablet (50 mg total) by mouth 2 (two) times daily.   Allergies  Allergen Reactions  . Acetaminophen Other (See Comments)    Cold sweats, general malaise  . Tylenol With Codeine #3  [Acetaminophen-Codeine]     Other reaction(s): Unknown   Recent Results (from the past 2160 hour(s))  Iron and TIBC     Status: None   Collection Time: 01/26/20  4:10 PM  Result Value Ref Range   Iron 65 28 - 170 ug/dL   TIBC 312 250 - 450 ug/dL   Saturation Ratios 21 10.4 - 31.8 %   UIBC 247 ug/dL    Comment: Performed at [facility], [address] [place], [place] [address]  Ferritin     Status: None   Collection Time: 01/26/20  4:10 PM  Result Value Ref Range   Ferritin 200 11 - 307 ng/mL    Comment: Performed at [facility], [address] [place], [place] [address]  Comprehensive metabolic panel     Status: Abnormal   Collection Time: 01/26/20  4:10 PM  Result Value Ref Range   Sodium 132 (L) 135 - 145 mmol/L   Potassium 4.1 3.5 - 5.1 mmol/L   Chloride 95 (L) 98 - 111 mmol/L   CO2 28 22 - 32 mmol/L   Glucose, Bld 102 (H) 70 - 99 mg/dL    Comment: Glucose reference range applies only to samples taken after fasting for at least 8 hours.   BUN 20 8 - 23 mg/dL   Creatinine, Ser 0.79 0.44 - 1.00 mg/dL   Calcium 9.3 8.9 - 10.3 mg/dL   Total Protein 7.1 6.5 - 8.1 g/dL   Albumin 4.0 3.5 - 5.0 g/dL   AST 23 15 - 41 U/L   ALT 31 0 - 44 U/L   Alkaline Phosphatase 49 38 - 126 U/L   Total Bilirubin 0.7 0.3 - 1.2 mg/dL   GFR calc non Af Amer >60 >60 mL/min   GFR calc Af Amer >60 >60 mL/min   Anion gap 9 5 -  15    Comment: Performed at [facility], [address] [place], [place] [address]  CBC with Differential/Platelet     Status: Abnormal   Collection Time: 01/26/20  4:10 PM  Result Value  Ref Range   WBC 5.0 4.0 - 10.5 K/uL   RBC 3.74 (L) 3.87 - 5.11 MIL/uL   Hemoglobin 11.3 (L) 12.0 - 15.0 g/dL   HCT 33.3 (L) 36.0 - 46.0 %   MCV 89.0 80.0 - 100.0 fL   MCH 30.2 26.0 - 34.0 pg   MCHC 33.9 30.0 - 36.0 g/dL   RDW 12.8 11.5 - 15.5 %   Platelets 236 150 - 400 K/uL   nRBC 0.0 0.0 - 0.2 %   Neutrophils Relative % 56 %   Neutro Abs 2.8 1.7 - 7.7 K/uL   Lymphocytes Relative 28 %   Lymphs Abs 1.4 0.7 - 4.0 K/uL   Monocytes Relative 10 %   Monocytes Absolute 0.5 0.1 - 1.0 K/uL   Eosinophils Relative 5 %   Eosinophils Absolute 0.2 0.0 - 0.5 K/uL   Basophils Relative 1 %   Basophils Absolute 0.0 0.0 - 0.1 K/uL   Immature Granulocytes 0 %   Abs Immature Granulocytes 0.02 0.00 - 0.07 K/uL    Comment: Performed at [facility], [address] [place], [place] [address]  Hemoglobin A1c     Status: None   Collection Time: 03/12/20  2:35 PM  Result Value Ref Range   Hgb A1c MFr Bld 6.0 4.6 - 6.5 %    Comment: Glycemic Control Guidelines for People with Diabetes:Non Diabetic:  <6%Goal of Therapy: <7%Additional Action Suggested:  >8%   Comprehensive metabolic panel     Status: Abnormal   Collection Time: 03/12/20  2:53 PM  Result Value Ref Range   Sodium 133 (L) 135 - 145 mEq/L   Potassium 4.1 3.5 - 5.1 mEq/L   Chloride 96 96 - 112 mEq/L   CO2 29 19 - 32 mEq/L   Glucose, Bld 98 70 - 99 mg/dL   BUN 14 6 - 23 mg/dL   Creatinine, Ser 0.88 0.40 - 1.20 mg/dL   Total Bilirubin 0.7 0.2 - 1.2 mg/dL   Alkaline Phosphatase 50 39 - 117 U/L   AST 23 0 - 37 U/L   ALT 18 0 - 35 U/L   Total Protein 7.6 6.0 - 8.3 g/dL   Albumin 4.5 3.5 - 5.2 g/dL   GFR 62.57 >60.00 mL/min   Calcium 9.7 8.4 - 10.5 mg/dL  Lipase     Status: None   Collection Time: 03/12/20  2:53 PM  Result Value Ref  Range   Lipase 15.0 11.0 - 59.0 U/L  CBC with Differential/Platelet     Status: None   Collection Time: 03/12/20  2:53 PM  Result Value Ref Range   WBC 5.5 4.0 - 10.5 K/uL   RBC 4.06 3.87 - 5.11 Mil/uL   Hemoglobin 12.6 12.0 - 15.0 g/dL   HCT 37.3 36.0 - 46.0 %   MCV 91.9 78.0 - 100.0 fl   MCHC 33.7 30.0 - 36.0 g/dL   RDW 13.4 11.5 - 15.5 %   Platelets 254.0 150.0 - 400.0 K/uL   Neutrophils Relative % 61.2 43.0 - 77.0 %   Lymphocytes Relative 26.7 12.0 - 46.0 %   Monocytes Relative 8.5 3.0 - 12.0 %   Eosinophils Relative 3.3 0.0 - 5.0 %   Basophils Relative 0.3 0.0 - 3.0 %   Neutro Abs 3.4 1.4 - 7.7 K/uL   Lymphs Abs 1.5 0.7 - 4.0 K/uL   Monocytes Absolute 0.5 0.1 - 1.0 K/uL   Eosinophils Absolute 0.2 0.0 - 0.7 K/uL   Basophils Absolute 0.0 0.0 -  0.1 K/uL   Objective  Body mass index is 23.59 kg/m. Wt Readings from Last 3 Encounters:  03/12/20 129 lb (58.5 kg)  01/26/20 130 lb 14.4 oz (59.4 kg)  12/28/19 123 lb (55.8 kg)   Temp Readings from Last 3 Encounters:  03/12/20 97.6 F (36.4 C) (Temporal)  01/26/20 98.2 F (36.8 C)  08/18/19 98.5 F (36.9 C) (Oral)   BP Readings from Last 3 Encounters:  03/12/20 122/74  01/26/20 (!) 102/45  12/28/19 116/71   Pulse Readings from Last 3 Encounters:  03/12/20 66  01/26/20 64  08/18/19 73    Physical Exam Vitals and nursing note reviewed.  Constitutional:      Appearance: Normal appearance. She is well-developed and well-groomed.  HENT:     Head: Normocephalic and atraumatic.     Right Ear: There is impacted cerumen.     Left Ear: There is impacted cerumen.  Eyes:     Conjunctiva/sclera: Conjunctivae normal.     Pupils: Pupils are equal, round, and reactive to light.  Cardiovascular:     Rate and Rhythm: Normal rate and regular rhythm.     Heart sounds: Normal heart sounds. No murmur.  Pulmonary:     Effort: Pulmonary effort is normal.     Breath sounds: Normal breath sounds.  Abdominal:     Tenderness: There  is abdominal tenderness in the epigastric area.     Comments: Mild epigastric ttp  Skin:    General: Skin is warm and moist.  Neurological:     General: No focal deficit present.     Mental Status: She is alert and oriented to person, place, and time. Mental status is at baseline.     Gait: Gait normal.  Psychiatric:        Attention and Perception: Attention and perception normal.        Mood and Affect: Mood and affect normal.        Speech: Speech normal.        Behavior: Behavior normal. Behavior is cooperative.        Thought Content: Thought content normal.        Cognition and Memory: Cognition and memory normal.        Judgment: Judgment normal.     Assessment  Plan  Essential hypertension - Plan: metoprolol tartrate (LOPRESSOR) 50 MG tablet bid  On lis-hct 20-25 will consider increase lis to 40 and d/c hctz   Prediabetes - Plan: Hemoglobin A1c  Epigastric pain - Plan: Comprehensive metabolic panel, Lipase, CBC with Differential/Platelet CT ab/pelvis shc 04/04/20  F/u Dr. Tasia Catchings  Establish with local GI   Otalgia of both ears - Plan: neomycin-polymyxin-hydrocortisone (CORTISPORIN) OTIC solution  Consent obtained for ear lavage b/l ears and pt tolerated and ears cleaned after procedure of ear lavage will Rx cortisporin due to ears appear irritated and red s/p lavage   HM Flu shot utd covid 1/2 03/13/20 2nd dose sch 04/03/20 rec Tdap, shingrix, prevnar if not had  She does report she had pna 23 vaccine 04/30/19 but no record  mammo referredwill ask daughter [name] to schedule  Consider cologuard when she gets insurance  Out of age window pap  Consider DEXA in future will order for pt to schedule Will need referral to eye MD when gets insurance right eye bothersome to her   Former PCP Dr. 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 Olivia Mackie McLean-Scocuzza-Internal Medicine

## 2020-03-12 NOTE — Patient Instructions (Addendum)
CT scan abdomen pelvis 04/04/20 in [place] trying to move up sooner  CT scan at [place] call to cancel 04/12/20 at [facility] for CT scan but still follow at [facility] for appointment with the doctor  Will get local GI doctor but still follow up at [facility] when needed  Follow up with Dr. Tasia Catchings as well    Multivitamin with iron daily for women Petra Kuba Made or Centrum)   Premier protein shake instead of Ensure   FUTURE APPOINTMENTS   Future Appointments  Date/Time Provider [place] Visit Type  04/12/2020 9:50 AM (Arrive by 9:35 AM) [facility]  04/12/2020 10:30 AM [facility] Ctr CT ABD PEL W  04/12/2020 12:00 PM (Arrive by 11:45 AM) [facility] Snowball, [place] VISIT    Results for Chelsey Lynch (MRN [id number]) as of 03/12/2020 14:23  Ref. Range 11/17/2019 08:44  Total CHOL/HDL Ratio Unknown 2  Cholesterol Latest Ref Range: 0 - 200 mg/dL 155  HDL Cholesterol Latest Ref Range: >39.00 mg/dL 68.80  LDL (calc) Latest Ref Range: 0 - 99 mg/dL 72  NonHDL Unknown 86.41  Triglycerides Latest Ref Range: 0.0 - 149.0 mg/dL 70.0  VLDL Latest Ref Range: 0.0 - 40.0 mg/dL 14.0  Results for Chelsey, Lynch (MRN [id number]) as of 03/12/2020 14:23   Ref. Range 11/17/2019 08:44  Hemoglobin A1C Latest Ref Range: 4.6 - 6.5 % 6.3  TSH Latest Ref Range: 0.35 - 4.50 uIU/mL 1.62  Results for Chelsey Lynch (MRN [id number]) as of 03/12/2020 14:23  Ref. Range 01/26/2020 16:10  Sodium Latest Ref Range: 135 - 145 mmol/L 132 (L)  Potassium Latest Ref Range: 3.5 - 5.1 mmol/L 4.1  Chloride Latest Ref Range: 98 - 111 mmol/L 95 (L)  CO2 Latest Ref Range: 22 - 32 mmol/L 28  Glucose Latest Ref Range: 70 - 99 mg/dL 102 (H)  BUN Latest Ref Range: 8 - 23 mg/dL 20  Creatinine Latest Ref Range: 0.44 - 1.00 mg/dL 0.79  Calcium Latest Ref Range: 8.9 - 10.3 mg/dL 9.3  Anion gap Latest Ref Range: 5 - 15  9  Alkaline Phosphatase  Latest Ref Range: 38 - 126 U/L 49  Albumin Latest Ref Range: 3.5 - 5.0 g/dL 4.0  AST Latest Ref Range: 15 - 41 U/L 23  ALT Latest Ref Range: 0 - 44 U/L 31  Total Protein Latest Ref Range: 6.5 - 8.1 g/dL 7.1  Total Bilirubin Latest Ref Range: 0.3 - 1.2 mg/dL 0.7  GFR, Est Non African American Latest Ref Range: >60 mL/min >60  GFR, Est African American Latest Ref Range: >60 mL/min >60  Results for Chelsey, Lynch (MRN [id number]) as of 03/12/2020 14:23  Ref. Range 11/17/2019 08:44  VITD Latest Ref Range: 30.00 - 100.00 ng/mL 47.83  Results for Chelsey, Lynch (MRN [id number]) as of 03/12/2020 14:23  Ref. Range 01/26/2020 16:10  RBC Latest Ref Range: 3.87 - 5.11 MIL/uL 3.74 (L)  Hemoglobin Latest Ref Range: 12.0 - 15.0 g/dL 11.3 (L)  HCT Latest Ref Range: 36.0 - 46.0 % 33.3 (L)  MCV Latest Ref Range: 80.0 - 100.0 fL 89.0  MCH Latest Ref Range: 26.0 - 34.0 pg 30.2  MCHC Latest Ref Range: 30.0 - 36.0 g/dL 33.9  RDW Latest Ref Range: 11.5 - 15.5 % 12.8  Platelets Latest Ref Range: 150 - 400 K/uL 236  nRBC Latest Ref Range: 0.0 - 0.2 % 0.0  Neutrophils  Latest Units: % 56  Lymphocytes Latest Units: % 28  Monocytes Relative Latest Units: % 10  Eosinophil Latest Units: % 5  Basophil Latest Units: % 1  Immature Granulocytes Latest Units: % 0  NEUT# Latest Ref Range: 1.7 - 7.7 K/uL 2.8  Lymphocyte # Latest Ref Range: 0.7 - 4.0 K/uL 1.4  Monocyte # Latest Ref Range: 0.1 - 1.0 K/uL 0.5  Eosinophils Absolute Latest Ref Range: 0.0 - 0.5 K/uL 0.2  Basophils Absolute Latest Ref Range: 0.0 - 0.1 K/uL 0.0  Abs Immature Granulocytes Latest Ref Range: 0.00 - 0.07 K/uL 0.02  Results for Chelsey, Lynch (MRN [id number]) as of 03/12/2020 14:23  Ref. Range 11/17/2019 08:44  Hepatitis C Ab Latest Ref Range: NON-REACTI  NON-REACTIVE  SIGNAL TO CUT-OFF Latest Ref Range: <1.00  0.04     Prediabetes Eating Plan Prediabetes is a condition that causes blood sugar (glucose) levels to be higher than normal. This increases the  risk for developing diabetes. In order to prevent diabetes from developing, your health care provider may recommend a diet and other lifestyle changes to help you:  Control your blood glucose levels.  Improve your cholesterol levels.  Manage your blood pressure. Your health care provider may recommend working with a diet and nutrition specialist (dietitian) to make a meal plan that is best for you. What are tips for following this plan? Lifestyle  Set weight loss goals with the help of your health care team. It is recommended that most people with prediabetes lose 7% of their current body weight.  Exercise for at least 30 minutes at least 5 days a week.  Attend a support group or seek ongoing support from a mental health counselor.  Take over-the-counter and prescription medicines only as told by your health care provider. Reading food labels  Read food labels to check the amount of fat, salt (sodium), and sugar in prepackaged foods. Avoid foods that have: ? Saturated fats. ? Trans fats. ? Added sugars.  Avoid foods that have more than 300 milligrams (mg) of sodium per serving. Limit your daily sodium intake to less than 2,300 mg each day. Shopping  Avoid buying pre-made and processed foods. Cooking  Cook with olive oil. Do not use butter, lard, or ghee.  Bake, broil, grill, or boil foods. Avoid frying. Meal planning   Work with your dietitian to develop an eating plan that is right for you. This may include: ? Tracking how many calories you take in. Use a food diary, notebook, or mobile application to track what you eat at each meal. ? Using the glycemic index (GI) to plan your meals. The index tells you how quickly a food will raise your blood glucose. Choose low-GI foods. These foods take a longer time to raise blood glucose.  Consider following a Mediterranean diet. This diet includes: ? Several servings each day of fresh fruits and vegetables. ? Eating fish at least  twice a week. ? Several servings each day of whole grains, beans, nuts, and seeds. ? Using olive oil instead of other fats. ? Moderate alcohol consumption. ? Eating small amounts of red meat and whole-fat dairy.  If you have high blood pressure, you may need to limit your sodium intake or follow a diet such as the DASH eating plan. DASH is an eating plan that aims to lower high blood pressure. What foods are recommended? The items listed below may not be a complete list. Talk with your dietitian about what dietary choices are best  for you. Grains Whole grains, such as whole-wheat or whole-grain breads, crackers, cereals, and pasta. Unsweetened oatmeal. Bulgur. Barley. Quinoa. Brown rice. Corn or whole-wheat flour tortillas or taco shells. Vegetables Lettuce. Spinach. Peas. Beets. Cauliflower. Cabbage. Broccoli. Carrots. Tomatoes. Squash. Eggplant. Herbs. Peppers. Onions. Cucumbers. Brussels sprouts. Fruits Berries. Bananas. Apples. Oranges. Grapes. Papaya. Mango. Pomegranate. Kiwi. Grapefruit. Cherries. Meats and other protein foods Seafood. Poultry without skin. Lean cuts of pork and beef. Tofu. Eggs. Nuts. Beans. Dairy Low-fat or fat-free dairy products, such as yogurt, cottage cheese, and cheese. Beverages Water. Tea. Coffee. Sugar-free or diet soda. Seltzer water. Lowfat or no-fat milk. Milk alternatives, such as soy or almond milk. Fats and oils Olive oil. Canola oil. Sunflower oil. Grapeseed oil. Avocado. Walnuts. Sweets and desserts Sugar-free or low-fat pudding. Sugar-free or low-fat ice cream and other frozen treats. Seasoning and other foods Herbs. Sodium-free spices. Mustard. Relish. Low-fat, low-sugar ketchup. Low-fat, low-sugar barbecue sauce. Low-fat or fat-free mayonnaise. What foods are not recommended? The items listed below may not be a complete list. Talk with your dietitian about what dietary choices are best for you. Grains Refined white flour and flour products,  such as bread, pasta, snack foods, and cereals. Vegetables Canned vegetables. Frozen vegetables with butter or cream sauce. Fruits Fruits canned with syrup. Meats and other protein foods Fatty cuts of meat. Poultry with skin. Breaded or fried meat. Processed meats. Dairy Full-fat yogurt, cheese, or milk. Beverages Sweetened drinks, such as sweet iced tea and soda. Fats and oils Butter. Lard. Ghee. Sweets and desserts Baked goods, such as cake, cupcakes, pastries, cookies, and cheesecake. Seasoning and other foods Spice mixes with added salt. Ketchup. Barbecue sauce. Mayonnaise. Summary  To prevent diabetes from developing, you may need to make diet and other lifestyle changes to help control blood sugar, improve cholesterol levels, and manage your blood pressure.  Set weight loss goals with the help of your health care team. It is recommended that most people with prediabetes lose 7 percent of their current body weight.  Consider following a Mediterranean diet that includes plenty of fresh fruits and vegetables, whole grains, beans, nuts, seeds, fish, lean meat, low-fat dairy, and healthy oils. This information is not intended to replace advice given to you by your health care provider. Make sure you discuss any questions you have with your health care provider. Document Revised: 02/10/2019 Document Reviewed: 12/23/2016 Elsevier Patient Education  2020 [employer].

## 2020-03-13 LAB — COMPREHENSIVE METABOLIC PANEL
ALT: 18 U/L (ref 0–35)
AST: 23 U/L (ref 0–37)
Albumin: 4.5 g/dL (ref 3.5–5.2)
Alkaline Phosphatase: 50 U/L (ref 39–117)
BUN: 14 mg/dL (ref 6–23)
CO2: 29 mEq/L (ref 19–32)
Calcium: 9.7 mg/dL (ref 8.4–10.5)
Chloride: 96 mEq/L (ref 96–112)
Creatinine, Ser: 0.88 mg/dL (ref 0.40–1.20)
GFR: 62.57 mL/min (ref >60.00)
Glucose, Bld: 98 mg/dL (ref 70–99)
Potassium: 4.1 mEq/L (ref 3.5–5.1)
Sodium: 133 mEq/L — ABNORMAL LOW (ref 135–145)
Total Bilirubin: 0.7 mg/dL (ref 0.2–1.2)
Total Protein: 7.6 g/dL (ref 6.0–8.3)

## 2020-03-13 LAB — CBC WITH DIFFERENTIAL/PLATELET
Basophils Absolute: 0 10*3/uL (ref 0.0–0.1)
Basophils Relative: 0.3 % (ref 0.0–3.0)
Eosinophils Absolute: 0.2 10*3/uL (ref 0.0–0.7)
Eosinophils Relative: 3.3 % (ref 0.0–5.0)
HCT: 37.3 % (ref 36.0–46.0)
Hemoglobin: 12.6 g/dL (ref 12.0–15.0)
Lymphocytes Relative: 26.7 % (ref 12.0–46.0)
Lymphs Abs: 1.5 10*3/uL (ref 0.7–4.0)
MCHC: 33.7 g/dL (ref 30.0–36.0)
MCV: 91.9 fl (ref 78.0–100.0)
Monocytes Absolute: 0.5 10*3/uL (ref 0.1–1.0)
Monocytes Relative: 8.5 % (ref 3.0–12.0)
Neutro Abs: 3.4 10*3/uL (ref 1.4–7.7)
Neutrophils Relative %: 61.2 % (ref 43.0–77.0)
Platelets: 254 10*3/uL (ref 150.0–400.0)
RBC: 4.06 Mil/uL (ref 3.87–5.11)
RDW: 13.4 % (ref 11.5–15.5)
WBC: 5.5 10*3/uL (ref 4.0–10.5)

## 2020-03-13 LAB — LIPASE: Lipase: 15 U/L (ref 11.0–59.0)

## 2020-03-13 LAB — HEMOGLOBIN A1C: Hgb A1c MFr Bld: 6 % (ref 4.6–6.5)

## 2020-03-22 ENCOUNTER — Encounter: Payer: Self-pay | Admitting: Internal Medicine

## 2020-03-22 DIAGNOSIS — R7303 Prediabetes: Secondary | ICD-10-CM | POA: Insufficient documentation

## 2020-03-26 ENCOUNTER — Encounter: Payer: Self-pay | Admitting: Internal Medicine

## 2020-03-26 ENCOUNTER — Telehealth: Payer: Self-pay | Admitting: Internal Medicine

## 2020-03-26 NOTE — Telephone Encounter (Signed)
Call and speak daughter Chelsey Lynch   Advise pt needs her or someone here each appt to help with language pt does well but needs someone here as there is still language barrier   1) Sodium slightly low this could be due to fluid pill hctz -does she want ot try lisinopril 40 mg alone w/o fluid pill? And stop lisinopril-hctz ???  Blood cts normal no longer anemic   A1C 6.0=prediabetes range   2) Pt was complaining of upper abdomen pain  -pending CT scan per Dr. Tasia Catchings scheduled 04/04/20 appt 04/05/20 Dr. Tasia Catchings   3) I will want her to periodically follow with [facility] doctors for GIST tumor in abdomen which she had surgery on but will try to get local doctors as well oncology (Dr. Tasia Catchings) and GI  -do they want to establish local GI as well or continue to follow with [facility] ??   No reason from labs for abdominal pain  4) Is she taking Gleevac 400 mg daily she should be? She told us she was not and this was given to her at [facility] -Dr. Tasia Catchings can you also confirm appt 04/05/20   5) Chelsey Lynch to call and schedule mammogram and bone density [place] both same day [phone]  Let me know answers to all above ?s' please

## 2020-03-26 NOTE — Telephone Encounter (Signed)
Ok noted   TMS 

## 2020-03-26 NOTE — Telephone Encounter (Signed)
Tried to call daughter [name], voice box is full and unable to schedule follow up appt.

## 2020-03-26 NOTE — Telephone Encounter (Signed)
She was recommended to take gleevec for adjuvant therapy. Daughter had called in April and informed us that she does not want to take treatments.

## 2020-03-29 MED ORDER — LISINOPRIL 40 MG PO TABS: 40.0000 mg | ORAL_TABLET | Freq: Every day | ORAL | 3 refills | Status: DC

## 2020-03-29 NOTE — Addendum Note (Signed)
Addended by: Orland Mustard on: 03/29/2020 06:23 PM   Modules accepted: Orders

## 2020-04-02 ENCOUNTER — Ambulatory Visit: Payer: Medicaid Other

## 2020-04-04 ENCOUNTER — Ambulatory Visit
Admission: RE | Admit: 2020-04-04 | Discharge: 2020-04-04 | Disposition: A | Discharge status: Home / Self Care | Payer: Medicare Other | Source: Ambulatory Visit | Attending: Oncology | Admitting: Oncology

## 2020-04-04 ENCOUNTER — Other Ambulatory Visit: Payer: Self-pay

## 2020-04-04 ENCOUNTER — Ambulatory Visit: Payer: Medicaid Other | Admitting: Oncology

## 2020-04-04 DIAGNOSIS — C49A0 Gastrointestinal stromal tumor, unspecified site: Secondary | ICD-10-CM

## 2020-04-04 MED ORDER — IOHEXOL 300 MG/ML  SOLN
100.0000 mL | Freq: Once | INTRAMUSCULAR | Status: AC | PRN
  Administered 2020-04-04: 100 mL via INTRAVENOUS

## 2020-04-05 ENCOUNTER — Telehealth: Payer: Self-pay | Admitting: *Deleted

## 2020-04-05 ENCOUNTER — Inpatient Hospital Stay: Payer: Medicare Other | Admitting: Oncology

## 2020-04-05 NOTE — Telephone Encounter (Signed)
Daughter [name] called scheduling to cancel patient appointment stating that she is having pain. I called and spoke with [name] who reports that patient had her second Vaccine last week and has had a headache form that as well as having discomfort from procedures yesterday and just does not feel like coming out today. I offered to transfer to scheduler to reschedule the appointment and she declined stating she needs to speak with her mother first, but will call back later today to reschedule appointment.

## 2020-04-08 ENCOUNTER — Telehealth: Payer: Self-pay | Admitting: Internal Medicine

## 2020-04-08 ENCOUNTER — Encounter: Payer: Self-pay | Admitting: Internal Medicine

## 2020-04-08 DIAGNOSIS — N281 Cyst of kidney, acquired: Secondary | ICD-10-CM | POA: Insufficient documentation

## 2020-04-08 DIAGNOSIS — I700 Atherosclerosis of aorta: Secondary | ICD-10-CM | POA: Insufficient documentation

## 2020-04-08 DIAGNOSIS — D1803 Hemangioma of intra-abdominal structures: Secondary | ICD-10-CM | POA: Insufficient documentation

## 2020-04-08 DIAGNOSIS — K7689 Other specified diseases of liver: Secondary | ICD-10-CM | POA: Insufficient documentation

## 2020-04-08 DIAGNOSIS — M5126 Other intervertebral disc displacement, lumbar region: Secondary | ICD-10-CM | POA: Insufficient documentation

## 2020-04-08 NOTE — Telephone Encounter (Signed)
Speak with daughter [name]    04/04/20 CT scan abdomen pelvis  No reason for abdominal pain other than constipation rec either senna/colace as needed for constipation otc or miralax in 8 ounces of liquid as needed   I think she needs to follow with oncology and GI with [facility] and or locally she recently missed oncology appt locally with Dr. Tasia Catchings.  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

## 2020-04-10 ENCOUNTER — Telehealth: Payer: Self-pay | Admitting: Internal Medicine

## 2020-04-10 NOTE — Telephone Encounter (Signed)
Richardine Service routed conversation to You 3 minutes ago (3:33 PM)  Richardine Service 3 minutes ago (3:33 PM)  IJ   Pt daughter was returning the call for mother      Documentation

## 2020-04-10 NOTE — Telephone Encounter (Signed)
No answer, no voicemail.

## 2020-04-10 NOTE — Telephone Encounter (Signed)
Pt daughter was returning the call for mother

## 2020-04-10 NOTE — Telephone Encounter (Signed)
Patients daughter informed and verbalized understanding

## 2020-04-10 NOTE — Telephone Encounter (Signed)
See previous phone encounter.

## 2020-04-12 ENCOUNTER — Encounter: Payer: Self-pay | Admitting: Oncology

## 2020-04-12 ENCOUNTER — Inpatient Hospital Stay: Payer: Medicare Other | Attending: Oncology | Admitting: Oncology

## 2020-04-12 ENCOUNTER — Other Ambulatory Visit: Payer: Self-pay

## 2020-04-12 VITALS — BP 119/64 | HR 60 | Temp 98.1°F | Wt 128.2 lb

## 2020-04-12 DIAGNOSIS — I119 Hypertensive heart disease without heart failure: Secondary | ICD-10-CM | POA: Insufficient documentation

## 2020-04-12 DIAGNOSIS — Z79899 Other long term (current) drug therapy: Secondary | ICD-10-CM | POA: Insufficient documentation

## 2020-04-12 DIAGNOSIS — D649 Anemia, unspecified: Secondary | ICD-10-CM

## 2020-04-12 DIAGNOSIS — C49A2 Gastrointestinal stromal tumor of stomach: Secondary | ICD-10-CM | POA: Insufficient documentation

## 2020-04-12 DIAGNOSIS — Z7982 Long term (current) use of aspirin: Secondary | ICD-10-CM | POA: Insufficient documentation

## 2020-04-12 DIAGNOSIS — E785 Hyperlipidemia, unspecified: Secondary | ICD-10-CM | POA: Diagnosis not present

## 2020-04-12 DIAGNOSIS — C49A0 Gastrointestinal stromal tumor, unspecified site: Secondary | ICD-10-CM | POA: Diagnosis not present

## 2020-04-12 DIAGNOSIS — Z803 Family history of malignant neoplasm of breast: Secondary | ICD-10-CM | POA: Diagnosis not present

## 2020-04-12 NOTE — Progress Notes (Signed)
Pt here for follow up. Pt reports having numbing to hands and feet approx 3-4 times a week.

## 2020-04-13 NOTE — Progress Notes (Signed)
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  Just was favored.  Patient was referred to [facility] Dr. Donnal Moat who recommended EUS with biopsy. 11/24/2019, patient underwent EUS biopsy. Pathology showed gastrointestinal stromal tumor, spindle cell type Patient also underwent colonoscopy on 11/30/2019.  Status post 1 colon polyp status resection.  Pathology showed tubular adenoma.  Left colon endoscopic biopsy showed chronic mucosa with ischemic pattern of 12/19/2019 patient underwent wedge gastrectomy Pathology showed GIST, spindle cell type, 3.2 cm, histology grade 2, high-grade, mitotic rate 11/52m2, Unifocal, margins were negative.  No lymph nodes were submitted.  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  Patient prefers to establish care locally for discussion of adjuvant treatment. Patient lost weight after surgery, appetite has improved.   also has experienced postsurgical abdominal discomfort at the lower abdomen which is improving.  Patient wears abdominal binder routinely.  Currently she is not taking oxycodone. Patient walks independently at home.  [place]  INTERVAL HISTORY Chelsey Brookoveris a 74y.o. female who has above history reviewed by me today presents for follow up visit for management of GIST Problems and complaints are listed  below: Patient was recommended to start [place]  She initially agreed and later on decided not to proceed with any adjuvant treatments. She is here for follow-up.  She is here by herself.  Denies any new complaints. Appetite is good.  Denies any unintentional weight loss, abdominal pain, diarrhea.  Review of Systems  Constitutional: Negative for fatigue.  Eyes: Negative for icterus.  Respiratory: Negative for cough and shortness of breath.   Cardiovascular: Negative for chest pain.  Gastrointestinal: Negative for abdominal distention.  Genitourinary: Negative for dysuria.   Musculoskeletal: Negative for arthralgias.  Skin: Negative for rash.  Neurological: Negative for headaches.  Hematological: Negative for adenopathy.  Psychiatric/Behavioral: Negative for confusion.    MEDICAL HISTORY:  Past Medical History:  Diagnosis Date  . Arthritis    left shoulder, right low back and right hip   . Hyperlipidemia   . Hypertension   . UTI (urinary tract infection)     SURGICAL HISTORY: Past Surgical History:  Procedure Laterality Date  . CATARACT EXTRACTION     b/l   . 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 Marital status: Divorced    Spouse name: Not on file  . Number of children: Not on file  . Years of education: Not on file  . Highest education level: Not on file  Occupational History  . Not on file  Tobacco Use  . Smoking status: Never Smoker  . Smokeless tobacco: Never Used  Substance and Sexual Activity  . Alcohol use: Not Currently  . Drug use: Not Currently  . Sexual activity: Not Currently  Other Topics Concern  .  Not on file  Social History Narrative   4 daughters    [place] here from [place], [place].    Currently not working and pending insurance as of 06/30/19    From the [employer]    Social Determinants of Health   Financial  Resource Strain:   . Difficulty of Paying Living Expenses:   Food Insecurity:   . Worried About Charity fundraiser in the Last Year:   . Arboriculturist in the Last Year:   Transportation Needs:   . Film/video editor (Medical):   Marland Kitchen Lack of Transportation (Non-Medical):   Physical Activity:   . Days of Exercise per Week:   . Minutes of Exercise per Session:   Stress:   . Feeling of Stress :   Social Connections:   . Frequency of Communication with Friends and Family:   . Frequency of Social Gatherings with Friends and Family:   . Attends Religious Services:   . Active Member of Clubs or Organizations:   . Attends Archivist Meetings:   Marland Kitchen Marital Status:   Intimate Partner Violence:   . Fear of Current or Ex-Partner:   . Emotionally Abused:   Marland Kitchen Physically Abused:   . Sexually Abused:     FAMILY HISTORY: Family History  Problem Relation Age of Onset  . Breast cancer Other     ALLERGIES:  is allergic to acetaminophen, hctz [hydrochlorothiazide], and tylenol with codeine #3  [acetaminophen-codeine].  MEDICATIONS:  Current Outpatient Medications  Medication Sig Dispense Refill  . amLODipine (NORVASC) 2.5 MG tablet Take 1 tablet (2.5 mg total) by mouth daily. If BP>130/>80 90 tablet 3  . Ascorbic Acid (VITAMIN C PO) Take by mouth daily.    Marland Kitchen aspirin EC 81 MG tablet Take 81 mg by mouth daily.    . Cholecalciferol (VITAMIN D3) 50 MCG (2000 UT) capsule Take 2,000 Units by mouth daily.    Marland Kitchen lisinopril (ZESTRIL) 40 MG tablet Take 1 tablet (40 mg total) by mouth daily. D/c lis 20-hctz 25 90 tablet 3  . metoprolol tartrate (LOPRESSOR) 50 MG tablet Take 1 tablet (50 mg total) by mouth 2 (two) times daily. 180 tablet 3  . multivitamin-lutein (OCUVITE-LUTEIN) CAPS capsule Take 1 capsule by mouth daily.    . Omega-3 Fatty Acids (OMEGA 3 PO) Take by mouth daily.    . simvastatin (ZOCOR) 20 MG tablet Take 1 tablet (20 mg total) by mouth daily at 6 PM. 90 tablet 3  .  imatinib (GLEEVEC) 400 MG tablet Take 1 tablet (400 mg total) by mouth daily. Take with meals and large glass of water.Caution:Chemotherapy. (Patient not taking: Reported on 03/12/2020) 30 tablet 1   No current facility-administered medications for this visit.     PHYSICAL EXAMINATION: ECOG PERFORMANCE STATUS: 0 - Asymptomatic Vitals:   04/12/20 1407  BP: 119/64  Pulse: 60  Temp: 98.1 F (36.7 C)  SpO2: 100%   Filed Weights   04/12/20 1407  Weight: 128 lb 3.2 oz (58.2 kg)    Physical Exam Constitutional:      General: She is not in acute distress.    Comments: Patient walks independently  HENT:     Head: Normocephalic and atraumatic.  Eyes:     General: No scleral icterus. Cardiovascular:     Rate and Rhythm: Normal rate and regular rhythm.     Heart sounds: Normal heart sounds.  Pulmonary:     Effort: Pulmonary effort is normal. No respiratory distress.  Breath sounds: No wheezing.  Abdominal:     General: Bowel sounds are normal. There is no distension.     Palpations: Abdomen is soft.  Musculoskeletal:        General: No deformity. Normal range of motion.     Cervical back: Normal range of motion and neck supple.  Skin:    General: Skin is warm and dry.     Findings: No erythema or rash.  Neurological:     Mental Status: She is alert and oriented to person, place, and time. Mental status is at baseline.     Cranial Nerves: No cranial nerve deficit.     Coordination: Coordination normal.  Psychiatric:        Mood and Affect: Mood normal.     LABORATORY DATA:  I have reviewed the data as listed Lab Results  Component Value Date   WBC 5.5 03/12/2020   HGB 12.6 03/12/2020   HCT 37.3 03/12/2020   MCV 91.9 03/12/2020   PLT 254.0 03/12/2020   Recent Labs    08/18/19 0816 08/18/19 0816 08/18/19 1500 11/17/19 0844 01/26/20 1610 03/12/20 1453  NA 127*   < > 132* 133* 132* 133*  K 4.0   < > 3.9 4.1 4.1 4.1  CL 89*   < > 96* 95* 95* 96  CO2 27   < >  '28 29 28 29  '$ GLUCOSE 114*   < > 97 100* 102* 98  BUN 16   < > '12 15 20 14  '$ CREATININE 0.82   < > 0.82 0.88 0.79 0.88  CALCIUM 9.1   < > 9.2 9.4 9.3 9.7  GFRNONAA >60  --  >60  --  >60  --   GFRAA >60  --  >60  --  >60  --   PROT 7.3   < >  --  7.4 7.1 7.6  ALBUMIN 4.2   < >  --  4.6 4.0 4.5  AST 26   < >  --  '24 23 23  '$ ALT 23   < >  --  '21 31 18  '$ ALKPHOS 53   < >  --  50 49 50  BILITOT 0.7   < >  --  0.7 0.7 0.7   < > = values in this interval not displayed.   Iron/TIBC/Ferritin/ %Sat    Component Value Date/Time   IRON 65 01/26/2020 1610   TIBC 312 01/26/2020 1610   FERRITIN 200 01/26/2020 1610   IRONPCTSAT 21 01/26/2020 1610      RADIOGRAPHIC STUDIES: I have personally reviewed the radiological images as listed and agreed with the findings in the report. CT Abdomen Pelvis W Contrast  Result Date: 04/05/2020 CLINICAL DATA:  Upper abdominal pain. History of GI stromal tumor resected in February. EXAM: CT ABDOMEN AND PELVIS WITH CONTRAST TECHNIQUE: Multidetector CT imaging of the abdomen and pelvis was performed using the standard protocol following bolus administration of intravenous contrast. CONTRAST:  188m OMNIPAQUE IOHEXOL 300 MG/ML  SOLN COMPARISON:  08/18/2019 FINDINGS: Lower chest: Left base scarring with mild bronchiectasis, similar. Mild to moderate cardiomegaly, without pericardial or pleural effusion. Hepatobiliary: Pericholecystic right hepatic lobe hemangioma, to 3.6 cm. Scattered hepatic cysts. A too small to characterize inferior right hepatic lobe lesion is similar. Subcentimeter right hepatic lobe hyperenhancing lesion is likely a hemangioma on 14/2. Normal gallbladder. No intrahepatic biliary duct dilatation. The common duct is upper normal for age, 8 mm. No choledocholithiasis. Pancreas: Normal, without mass or  ductal dilatation. Spleen: Normal in size, without focal abnormality. Adrenals/Urinary Tract: Normal adrenal glands. Right renal too small to characterize  lesions are most likely cysts. Normal left kidney and urinary bladder. Stomach/Bowel: Interval resection of previously described gastric body lesion. Moderate stool within the rectum and sigmoid. Normal terminal ileum and appendix. Normal small bowel. Vascular/Lymphatic: Aortic and branch vessel atherosclerosis. No abdominopelvic adenopathy. Reproductive: Normal uterus and adnexa. Other: No significant free fluid.  No free intraperitoneal air. Musculoskeletal: Scattered pelvic sclerotic lesions are likely bone islands. Disc bulges at L3-4 and L4-5. IMPRESSION: 1. No acute process in the abdomen or pelvis. 2. Interval resection of previously described gastric body lesion. 3. Possible constipation and fecal impaction. 4. Aortic Atherosclerosis (ICD10-I70.0). Electronically Signed   By: Abigail Miyamoto M.D.   On: 04/05/2020 11:16      ASSESSMENT & PLAN:  1. Gastrointestinal stromal tumor (GIST) ([place])   2. Normocytic anemia    Pathology was reviewed and discussed with patient daughter. GIST of stomach, status post resection -Tumor size 3.2 cm, high mitotic rate 11/30m2, rare exon 13 Mutation. Patient was recommended to start aEscobareswhich she declined. I had a lengthy discussion with the patient today. She expresses her wishes of not to be a burden of her family members for multiple clinic visits, she also concerned about potential side effects of reflux.  She does want to have adjuvant treatments.  She understands that recurrence rate may be higher without any adjuvant treatments.  CT abdomen pelvis surveillance scan was independently reviewed by me and discussed with patient. No recurrence.  Recommend repeat CT scan in 4-6 months. And follow up in the clinic.   Normocytic anemia, 03/12/2020 hemoglobin is stable.  We spent sufficient time to discuss many aspect of care, questions were answered to patient's satisfaction.   Orders Placed This Encounter  Procedures  . CT Abdomen Pelvis W  Contrast    Standing Status:   Future    Standing Expiration Date:   04/12/2021    Order Specific Question:   ** REASON FOR EXAM (FREE TEXT)    Answer:   GIST    Order Specific Question:   If indicated for the ordered procedure, I authorize the administration of contrast media per Radiology protocol    Answer:   Yes    Order Specific Question:   Preferred imaging location?    Answer:   [facility]    Order Specific Question:   Is Oral Contrast requested for this exam?    Answer:   Yes, Per Radiology protocol    Order Specific Question:   Radiology Contrast Protocol - do NOT remove file path    Answer:   [email or web address]  . CBC with Differential/Platelet    Standing Status:   Future    Standing Expiration Date:   04/12/2021  . Comprehensive metabolic panel    Standing Status:   Future    Standing Expiration Date:   04/12/2021    All questions were answered. The patient knows to call the clinic with any problems questions or concerns.  cc McLean-Scocuzza, TOlivia Mackie*    Return of visit: 4 months ZEarlie Server MD, PhD Hematology Oncology CLex[facility] Regional [facility] Centerat ASchulze Surgery Center IncPager- [phone]/10/2020

## 2020-04-15 ENCOUNTER — Telehealth: Payer: Self-pay

## 2020-04-15 NOTE — Telephone Encounter (Signed)
Done...  All appts R/S as requested 10/12 labs @ 1000 Same day 11:00 am  CT @ [facility] CT IMAGING [address]  [id number] [place] [address] [phone]  RTC to see Dr. Tasia Catchings on 08/15/20 @ 10:00 am

## 2020-04-15 NOTE — Telephone Encounter (Signed)
Unable to reach pt by phone and unable to leave VM as VM is full. Mychart message has been sent and I will mail new appointments.

## 2020-04-15 NOTE — Telephone Encounter (Signed)
-----   Message from Earlie Server, MD sent at 04/13/2020  9:38 PM EDT ----- Please change her CT to be done in 4 months and follow up in 4 months after CT. Thanks.  Check out note says 4 weeks.

## 2020-04-15 NOTE — Telephone Encounter (Signed)
Please change appts as requested and I will call pt with new appts. Thanks

## 2020-05-13 ENCOUNTER — Other Ambulatory Visit: Payer: Medicare Other

## 2020-05-13 ENCOUNTER — Ambulatory Visit: Payer: Medicare Other

## 2020-05-15 ENCOUNTER — Ambulatory Visit: Payer: Medicare Other | Admitting: Oncology

## 2020-06-19 ENCOUNTER — Encounter: Payer: Self-pay | Admitting: Internal Medicine

## 2020-07-19 ENCOUNTER — Encounter: Payer: Self-pay | Admitting: Nurse Practitioner

## 2020-07-19 ENCOUNTER — Telehealth (INDEPENDENT_AMBULATORY_CARE_PROVIDER_SITE_OTHER): Payer: Medicare Other | Admitting: Nurse Practitioner

## 2020-07-19 ENCOUNTER — Other Ambulatory Visit: Payer: Self-pay

## 2020-07-19 VITALS — BP 112/72 | HR 60 | Ht 62.0 in | Wt 128.0 lb

## 2020-07-19 DIAGNOSIS — M545 Low back pain, unspecified: Secondary | ICD-10-CM | POA: Insufficient documentation

## 2020-07-19 DIAGNOSIS — C49A0 Gastrointestinal stromal tumor, unspecified site: Secondary | ICD-10-CM

## 2020-07-19 DIAGNOSIS — M546 Pain in thoracic spine: Secondary | ICD-10-CM

## 2020-07-19 DIAGNOSIS — G8929 Other chronic pain: Secondary | ICD-10-CM | POA: Diagnosis not present

## 2020-07-19 NOTE — Patient Instructions (Addendum)
Please set up bone density and mammogram  Please call and schedule your 3D mammogram and bone density as discussed  Chelsey Lynch  [address] [place], [place] [phone]  I have ordered the lumbar and thoracic Xray's- to be scheduled at [facility]. You will need to set up the appointment.   I have ordered a referral to [facility]- Dr. Sharlet Salina who helps with pain in the back.   Please use heat as you think that helps. You are taking occasional ibuprofen and avoiding Tylenol as you report you are allergic. Ok to use [employer] as you do not like to take medication.   Please call back and make a sooner appt with Dr. Aundra Dubin for follow up in a few weeks.       Chronic Back Pain When back pain lasts longer than 3 months, it is called chronic back pain.The cause of your back pain may not be known. Some common causes include:  Wear and tear (degenerative disease) of the bones, ligaments, or disks in your back.  Inflammation and stiffness in your back (arthritis). People who have chronic back pain often go through certain periods in which the pain is more intense (flare-ups). Many people can learn to manage the pain with home care. Follow these instructions at home: Pay attention to any changes in your symptoms. Take these actions to help with your pain: Activity   Avoid bending and other activities that make the problem worse.  Maintain a proper position when standing or sitting: ? When standing, keep your upper back and neck straight, with your shoulders pulled back. Avoid slouching. ? When sitting, keep your back straight and relax your shoulders. Do not round your shoulders or pull them backward.  Do not sit or stand in one place for long periods of time.  Take brief periods of rest throughout the day. This will reduce your pain. Resting in a lying or standing position is usually better than sitting to rest.  When you are resting  for longer periods, mix in some mild activity or stretching between periods of rest. This will help to prevent stiffness and pain.  Get regular exercise. Ask your health care provider what activities are safe for you.  Do not lift anything that is heavier than 10 lb (4.5 kg). Always use proper lifting technique, which includes: ? Bending your knees. ? Keeping the load close to your body. ? Avoiding twisting.  Sleep on a firm mattress in a comfortable position. Try lying on your side with your knees slightly bent. If you lie on your back, put a pillow under your knees. Managing pain  If directed, apply ice to the painful area. Your health care provider may recommend applying ice during the first 24-48 hours after a flare-up begins. ? Put ice in a plastic bag. ? Place a towel between your skin and the bag. ? Leave the ice on for 20 minutes, 2-3 times per day.  If directed, apply heat to the affected area as often as told by your health care provider. Use the heat source that your health care provider recommends, such as a moist heat pack or a heating pad. ? Place a towel between your skin and the heat source. ? Leave the heat on for 20-30 minutes. ? Remove the heat if your skin turns bright red. This is especially important if you are unable to feel pain, heat, or cold. You may have a greater risk of getting burned.  Try soaking in a warm tub.  Take over-the-counter and prescription medicines only as told by your health care provider.  Keep all follow-up visits as told by your health care provider. This is important. Contact a health care provider if:  You have pain that is not relieved with rest or medicine. Get help right away if:  You have weakness or numbness in one or both of your legs or feet.  You have trouble controlling your bladder or your bowels.  You have nausea or vomiting.  You have pain in your abdomen.  You have shortness of breath or you faint. This information  is not intended to replace advice given to you by your health care provider. Make sure you discuss any questions you have with your health care provider. Document Revised: 02/09/2019 Document Reviewed: 04/28/2017 Elsevier Patient Education  [place].

## 2020-07-19 NOTE — Progress Notes (Signed)
Virtual Visit via Video Note  This visit type was conducted due to national recommendations for restrictions regarding the COVID-19 pandemic (e.g. social distancing).  This format is felt to be most appropriate for this patient at this time.  All issues noted in this document were discussed and addressed.  No physical exam was performed (except for noted visual exam findings with Video Visits).   I connected with@ on 07/19/20 at  4:30 PM EDT by a video enabled telemedicine application or telephone and verified that I am speaking with the correct person using two identifiers. Location patient: home Location provider: work or home office Persons participating in the virtual visit: patient, provider  I discussed the limitations, risks, security and privacy concerns of performing an evaluation and management service by telephone and the availability of in person appointments. I also discussed with the patient that there may be a patient responsible charge related to this service. The patient expressed understanding and agreed to proceed.  Reason for visit: Ongoing back pain for years, no injuries or falls  HPI: This 74 yo with hx of GI stromal tumor resection, lumbar herniated disc, physical deconditioning, chronic bilateral thoracic back pain reports persistent back pain complaints. The conversation today is through her daughter Darel Hong who is the [place] interpreter for the entire visit. The patient has been having pain between her shoulder blades, lumbar to her hip pain for the last 15 years. She just has a few days where it does not hurt but most days she has pain. She is unable to quantitate the severity or describe the quality. There is no radiculopathy pain in Uext or Lex and no numbness, tingling, weakness or change in sensation. No problem with moving/controlling her bowel movements or bladder. No acute changes. With further questioning, this is the same pain that she has had for years and she  made the video appt because she has been taking ibuprofen over-the-counter and it is upsetting her stomach and is not helping. She has been applying heat, using rubs of Vicks. She does use pain patches- which do help- not sure if it [employer],  but they are expensive. She is allergic to Tylenol so she avoids Tylenol products. She does not know what else to do. Her daughter, Darel Hong notes that the patient does not like to take medication. She is able to do all ADL and is active daily.    A recent CT of the abdomen and pelvis with contrast performed 04/04/2020 showed interval resection of previously described gastric body lesion, no acute process in the abdomen or pelvis, musculoskeletal with scattered pelvic sclerotic lesions likely bone islands. Disc bulges at L3-L4 and L4-L5. Positive constipation with fecal impaction and aortic atherosclerosis  She has not completed the bone density or mammogram studies as previously ordered,  but will do so.  ROS: See pertinent positives and negatives per HPI.  Past Medical History:  Diagnosis Date  . Arthritis    left shoulder, right low back and right hip   . Hyperlipidemia   . Hypertension   . UTI (urinary tract infection)     Past Surgical History:  Procedure Laterality Date  . CATARACT EXTRACTION     b/l   . Gastrointestinal stromal tumor (GIST) of stomach  12/19/2019   [facility] 12/19/19 KIT exon 13 detected +    Family History  Problem Relation Age of Onset  . Breast cancer Other     SOCIAL HX: Non smoker   Current Outpatient Medications:  .  amLODipine (NORVASC) 2.5 MG tablet, Take 1 tablet (2.5 mg total) by mouth daily. If BP>130/>80, Disp: 90 tablet, Rfl: 3 .  Ascorbic Acid (VITAMIN C PO), Take by mouth daily., Disp: , Rfl:  .  aspirin EC 81 MG tablet, Take 81 mg by mouth daily., Disp: , Rfl:  .  B Complex Vitamins (VITAMIN B COMPLEX PO), Take by mouth., Disp: , Rfl:  .  Cholecalciferol (VITAMIN D3) 50 MCG (2000 UT) capsule, Take 2,000 Units  by mouth daily., Disp: , Rfl:  .  imatinib (GLEEVEC) 400 MG tablet, Take 1 tablet (400 mg total) by mouth daily. Take with meals and large glass of water.Caution:Chemotherapy., Disp: 30 tablet, Rfl: 1 .  lisinopril (ZESTRIL) 40 MG tablet, Take 1 tablet (40 mg total) by mouth daily. D/c lis 20-hctz 25, Disp: 90 tablet, Rfl: 3 .  metoprolol tartrate (LOPRESSOR) 50 MG tablet, Take 1 tablet (50 mg total) by mouth 2 (two) times daily., Disp: 180 tablet, Rfl: 3 .  multivitamin-lutein (OCUVITE-LUTEIN) CAPS capsule, Take 1 capsule by mouth daily., Disp: , Rfl:  .  Omega-3 Fatty Acids (OMEGA 3 PO), Take by mouth daily., Disp: , Rfl:  .  simvastatin (ZOCOR) 20 MG tablet, Take 1 tablet (20 mg total) by mouth daily at 6 PM., Disp: 90 tablet, Rfl: 3  EXAM:  VITALS per patient if applicable:  GENERAL: alert, oriented, appears well and in no acute distress  HEENT: atraumatic, conjunctiva clear, no obvious abnormalities on inspection of external nose and ears  NECK: normal movements of the head and neck  LUNGS: on inspection no signs of respiratory distress, breathing rate appears normal, no obvious gross SOB, gasping or wheezing  CV: no obvious cyanosis  MS: moves all visible extremities without noticeable abnormality. 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  Please set up bone density and mammogram  Please call and schedule your 3D mammogram and bone  density as discussed  [place]  [address] [place], [place] [phone]  I have ordered the lumbar and thoracic Xray's- to be scheduled at [facility]. You will need to set up the appointment.   I have ordered a referral to [facility]- Dr. Sharlet Salina who helps with pain in the back.   Please use heat as you think that helps. You are taking occasional ibuprofen and avoiding Tylenol as you report you are allergic. Ok to use [employer] as you do not like to take medication.   Please call back and make a sooner appt with Dr. Aundra Dubin for follow up in a few weeks.    The patient was advised to call back or seek an in-person evaluation if the symptoms worsen or if the condition fails to improve as anticipated.  I provided 30  minutes of face-to-face time during this encounter.  Denice Paradise, NP Adult Nurse Practitioner [place] [phone]

## 2020-07-22 ENCOUNTER — Encounter: Payer: Self-pay | Admitting: Nurse Practitioner

## 2020-08-01 ENCOUNTER — Other Ambulatory Visit: Payer: Self-pay

## 2020-08-01 DIAGNOSIS — E785 Hyperlipidemia, unspecified: Secondary | ICD-10-CM

## 2020-08-01 MED ORDER — SIMVASTATIN 20 MG PO TABS: 20.0000 mg | ORAL_TABLET | Freq: Every day | ORAL | 1 refills | Status: DC

## 2020-08-12 ENCOUNTER — Other Ambulatory Visit: Payer: Self-pay | Admitting: Family Medicine

## 2020-08-12 DIAGNOSIS — S12590A Other displaced fracture of sixth cervical vertebra, initial encounter for closed fracture: Secondary | ICD-10-CM

## 2020-08-13 ENCOUNTER — Other Ambulatory Visit: Payer: Medicare Other

## 2020-08-15 ENCOUNTER — Ambulatory Visit
Admission: RE | Admit: 2020-08-15 | Discharge: 2020-08-15 | Disposition: A | Discharge status: Home / Self Care | Payer: Medicare Other | Source: Ambulatory Visit | Attending: Oncology | Admitting: Oncology

## 2020-08-15 ENCOUNTER — Other Ambulatory Visit: Payer: Self-pay

## 2020-08-15 ENCOUNTER — Inpatient Hospital Stay: Payer: Medicare Other | Attending: Oncology

## 2020-08-15 ENCOUNTER — Ambulatory Visit: Payer: Medicare Other | Admitting: Oncology

## 2020-08-15 DIAGNOSIS — K5900 Constipation, unspecified: Secondary | ICD-10-CM | POA: Insufficient documentation

## 2020-08-15 DIAGNOSIS — C49A2 Gastrointestinal stromal tumor of stomach: Secondary | ICD-10-CM | POA: Insufficient documentation

## 2020-08-15 DIAGNOSIS — C49A0 Gastrointestinal stromal tumor, unspecified site: Secondary | ICD-10-CM | POA: Diagnosis present

## 2020-08-15 LAB — COMPREHENSIVE METABOLIC PANEL
ALT: 35 U/L (ref 0–44)
AST: 31 U/L (ref 15–41)
Albumin: 4.2 g/dL (ref 3.5–5.0)
Alkaline Phosphatase: 47 U/L (ref 38–126)
Anion gap: 8 (ref 5–15)
BUN: 12 mg/dL (ref 8–23)
CO2: 28 mmol/L (ref 22–32)
Calcium: 9.1 mg/dL (ref 8.9–10.3)
Chloride: 99 mmol/L (ref 98–111)
Creatinine, Ser: 0.83 mg/dL (ref 0.44–1.00)
GFR, Estimated: >60 mL/min (ref >60)
Glucose, Bld: 101 mg/dL — ABNORMAL HIGH (ref 70–99)
Potassium: 4.6 mmol/L (ref 3.5–5.1)
Sodium: 135 mmol/L (ref 135–145)
Total Bilirubin: 1.1 mg/dL (ref 0.3–1.2)
Total Protein: 7.5 g/dL (ref 6.5–8.1)

## 2020-08-15 LAB — CBC WITH DIFFERENTIAL/PLATELET
Abs Immature Granulocytes: 0.02 10*3/uL (ref 0.00–0.07)
Basophils Absolute: 0 10*3/uL (ref 0.0–0.1)
Basophils Relative: 1 %
Eosinophils Absolute: 0.1 10*3/uL (ref 0.0–0.5)
Eosinophils Relative: 3 %
HCT: 36.6 % (ref 36.0–46.0)
Hemoglobin: 12.5 g/dL (ref 12.0–15.0)
Immature Granulocytes: 0 %
Lymphocytes Relative: 24 %
Lymphs Abs: 1.3 10*3/uL (ref 0.7–4.0)
MCH: 30.7 pg (ref 26.0–34.0)
MCHC: 34.2 g/dL (ref 30.0–36.0)
MCV: 89.9 fL (ref 80.0–100.0)
Monocytes Absolute: 0.5 10*3/uL (ref 0.1–1.0)
Monocytes Relative: 8 %
Neutro Abs: 3.6 10*3/uL (ref 1.7–7.7)
Neutrophils Relative %: 64 %
Platelets: 216 10*3/uL (ref 150–400)
RBC: 4.07 MIL/uL (ref 3.87–5.11)
RDW: 12.4 % (ref 11.5–15.5)
WBC: 5.6 10*3/uL (ref 4.0–10.5)
nRBC: 0 % (ref 0.0–0.2)

## 2020-08-15 MED ORDER — IOHEXOL 300 MG/ML  SOLN
75.0000 mL | Freq: Once | INTRAMUSCULAR | Status: AC | PRN
  Administered 2020-08-15: 100 mL via INTRAVENOUS

## 2020-08-16 ENCOUNTER — Ambulatory Visit: Payer: Medicare Other | Admitting: Internal Medicine

## 2020-08-23 ENCOUNTER — Encounter: Payer: Self-pay | Admitting: Oncology

## 2020-08-23 ENCOUNTER — Other Ambulatory Visit: Payer: Self-pay

## 2020-08-23 ENCOUNTER — Inpatient Hospital Stay (HOSPITAL_BASED_OUTPATIENT_CLINIC_OR_DEPARTMENT_OTHER): Payer: Medicare Other | Admitting: Oncology

## 2020-08-23 VITALS — BP 114/68 | HR 56 | Temp 97.3°F | Resp 18 | Wt 122.9 lb

## 2020-08-23 DIAGNOSIS — K5900 Constipation, unspecified: Secondary | ICD-10-CM

## 2020-08-23 DIAGNOSIS — C49A0 Gastrointestinal stromal tumor, unspecified site: Secondary | ICD-10-CM

## 2020-08-23 DIAGNOSIS — C49A2 Gastrointestinal stromal tumor of stomach: Secondary | ICD-10-CM | POA: Diagnosis not present

## 2020-08-23 NOTE — Progress Notes (Signed)
Hematology/Oncology Consult note [facility] Telephone:([phone] [phone]   Patient Care Team: McLean-Scocuzza, Nino Glow, MD as PCP - General (Internal Medicine)  REFERRING PROVIDER: McLean-Scocuzza, Olivia Mackie *  CHIEF COMPLAINTS/REASON FOR VISIT:  Evaluation of GIST  HISTORY OF PRESENTING ILLNESS:   Chelsey Lynch is a  74 y.o.  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  Just was favored.  Patient was referred to [facility] Dr. Donnal Moat who recommended EUS with biopsy. 11/24/2019, patient underwent EUS biopsy. Pathology showed gastrointestinal stromal tumor, spindle cell type Patient also underwent colonoscopy on 11/30/2019.  Status post 1 colon polyp status resection.  Pathology showed tubular adenoma.  Left colon endoscopic biopsy showed chronic mucosa with ischemic pattern of 12/19/2019 patient underwent wedge gastrectomy Pathology showed GIST, spindle cell type, 3.2 cm, histology grade 2, high-grade, mitotic rate 11/31m2, Unifocal, margins were negative.  No lymph nodes were submitted.  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  Patient prefers to establish care locally for discussion of adjuvant treatment. Patient lost weight after surgery, appetite has improved.   also has experienced postsurgical abdominal discomfort at the lower abdomen which is improving.  Patient wears abdominal binder routinely.  Currently she is not taking oxycodone. Patient walks independently at home.  Lives with daughter in [place]  INTERVAL HISTORY Chelsey Lindois a 74y.o. female who has above history reviewed by me today presents for follow up visit for management of GIST Problems and complaints are listed  below: Patient was recommended to start a[place]   She reports having constipation for which she take senna which caused diarrhea. Otherwise no new complaints.    Review of Systems  Constitutional: Negative for fatigue.  Eyes: Negative for icterus.  Respiratory: Negative for cough and shortness of breath.   Cardiovascular: Negative for chest pain.  Gastrointestinal: Negative for abdominal distention.  Genitourinary: Negative for dysuria.   Musculoskeletal: Negative for arthralgias.  Skin: Negative for rash.  Neurological: Negative for headaches.  Hematological: Negative for adenopathy.  Psychiatric/Behavioral: Negative for confusion.    MEDICAL HISTORY:  Past Medical History:  Diagnosis Date  . Arthritis    left shoulder, right low back and right hip   . Hyperlipidemia   . Hypertension   . UTI (urinary tract infection)     SURGICAL HISTORY: Past Surgical History:  Procedure Laterality Date  . CATARACT EXTRACTION     b/l   . 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 Marital status: Divorced    Spouse name: Not on file  . Number of children: Not on file  . Years of education: Not on file  . Highest education level: Not on file  Occupational History  . Not on file  Tobacco Use  . Smoking status: Never Smoker  . Smokeless tobacco: Never Used  Substance and Sexual Activity  . Alcohol use: Not Currently  . Drug use: Not Currently  . Sexual activity: Not Currently  Other Topics Concern  . Not on file  Social History Narrative   4 daughters    [place] [employer]    Currently  not working and pending insurance as of 06/30/19    From the [employer]    Social Determinants of Health   Financial Resource Strain:   . Difficulty of Paying Living Expenses: Not on file  Food Insecurity:   . Worried About Charity fundraiser in the  Last Year: Not on file  . Ran Out of Food in the Last Year: Not on file  Transportation Needs:   . Lack of Transportation (Medical): Not on file  . Lack of Transportation (Non-Medical): Not on file  Physical Activity:   . Days of Exercise per Week: Not on file  . Minutes of Exercise per Session: Not on file  Stress:   . Feeling of Stress : Not on file  Social Connections:   . Frequency of Communication with Friends and Family: Not on file  . Frequency of Social Gatherings with Friends and Family: Not on file  . Attends Religious Services: Not on file  . Active Member of Clubs or Organizations: Not on file  . Attends Archivist Meetings: Not on file  . Marital Status: Not on file  Intimate Partner Violence:   . Fear of Current or Ex-Partner: Not on file  . Emotionally Abused: Not on file  . Physically Abused: Not on file  . Sexually Abused: Not on file    FAMILY HISTORY: Family History  Problem Relation Age of Onset  . Breast cancer Other     ALLERGIES:  is allergic to acetaminophen, hctz [hydrochlorothiazide], and tylenol with codeine #3  [acetaminophen-codeine].  MEDICATIONS:  Current Outpatient Medications  Medication Sig Dispense Refill  . amLODipine (NORVASC) 2.5 MG tablet Take 1 tablet (2.5 mg total) by mouth daily. If BP>130/>80 90 tablet 3  . Ascorbic Acid (VITAMIN C PO) Take by mouth daily.    Marland Kitchen aspirin EC 81 MG tablet Take 81 mg by mouth daily.    . B Complex Vitamins (VITAMIN B COMPLEX PO) Take by mouth.    . Cholecalciferol (VITAMIN D3) 50 MCG (2000 UT) capsule Take 2,000 Units by mouth daily.    Marland Kitchen lisinopril (ZESTRIL) 40 MG tablet Take 1 tablet (40 mg total) by mouth daily. D/c lis 20-hctz 25 90 tablet 3  . metoprolol tartrate (LOPRESSOR) 50 MG tablet Take 1 tablet (50 mg total) by mouth 2 (two) times daily. 180 tablet 3  . multivitamin-lutein (OCUVITE-LUTEIN) CAPS capsule Take 1 capsule by mouth daily.    . Omega-3 Fatty Acids (OMEGA 3 PO) Take by  mouth daily.    . simvastatin (ZOCOR) 20 MG tablet Take 1 tablet (20 mg total) by mouth daily at 6 PM. 90 tablet 1  . imatinib (GLEEVEC) 400 MG tablet Take 1 tablet (400 mg total) by mouth daily. Take with meals and large glass of water.Caution:Chemotherapy. (Patient not taking: Reported on 08/23/2020) 30 tablet 1   No current facility-administered medications for this visit.     PHYSICAL EXAMINATION: ECOG PERFORMANCE STATUS: 0 - Asymptomatic Vitals:   08/23/20 1429  BP: 114/68  Pulse: (!) 56  Resp: 18  Temp: (!) 97.3 F (36.3 C)   Filed Weights   08/23/20 1429  Weight: 122 lb 14.4 oz (55.7 kg)    Physical Exam Constitutional:      General: She is not in acute distress.    Comments: Patient walks independently  HENT:     Head: Normocephalic and atraumatic.  Eyes:     General: No scleral icterus. Cardiovascular:     Rate and Rhythm:  Normal rate and regular rhythm.     Heart sounds: Normal heart sounds.  Pulmonary:     Effort: Pulmonary effort is normal. No respiratory distress.     Breath sounds: No wheezing.  Abdominal:     General: Bowel sounds are normal. There is no distension.     Palpations: Abdomen is soft.  Musculoskeletal:        General: No deformity. Normal range of motion.     Cervical back: Normal range of motion and neck supple.  Skin:    General: Skin is warm and dry.     Findings: No erythema or rash.  Neurological:     Mental Status: She is alert and oriented to person, place, and time. Mental status is at baseline.     Cranial Nerves: No cranial nerve deficit.     Coordination: Coordination normal.  Psychiatric:        Mood and Affect: Mood normal.     LABORATORY DATA:  I have reviewed the data as listed Lab Results  Component Value Date   WBC 5.6 08/15/2020   HGB 12.5 08/15/2020   HCT 36.6 08/15/2020   MCV 89.9 08/15/2020   PLT 216 08/15/2020   Recent Labs    01/26/20 1610 03/12/20 1453 08/15/20 1259  NA 132* 133* 135  K 4.1 4.1  4.6  CL 95* 96 99  CO2 _0 GLUCOSE 102* 98 101*  BUN _1 CREATININE 0.79 0.88 0.83  CALCIUM 9.3 9.7 9.1  GFRNONAA >60  --  >60  GFRAA >60  --   --   PROT 7.1 7.6 7.5  ALBUMIN 4.0 4.5 4.2  AST _2 ALT 31 18 35  ALKPHOS 49 50 47  BILITOT 0.7 0.7 1.1   Iron/TIBC/Ferritin/ %Sat    Component Value Date/Time   IRON 65 01/26/2020 1610   TIBC 312 01/26/2020 1610   FERRITIN 200 01/26/2020 1610   IRONPCTSAT 21 01/26/2020 1610      RADIOGRAPHIC STUDIES: I have personally reviewed the radiological images as listed and agreed with the findings in the report. CT Abdomen Pelvis W Contrast  Result Date: 08/15/2020 CLINICAL DATA:  Gastric body GIST excised 12/2019, persistent upper abdominal pain EXAM: CT ABDOMEN AND PELVIS WITH CONTRAST TECHNIQUE: Multidetector CT imaging of the abdomen and pelvis was performed using the standard protocol following bolus administration of intravenous contrast. CONTRAST:  172m OMNIPAQUE IOHEXOL 300 MG/ML  SOLN COMPARISON:  04/04/2020 FINDINGS: Lower chest: No acute abnormality. Hepatobiliary: No solid liver abnormality is seen. Redemonstrated benign hemangioma of the subcapsular anterior right lobe of the liver measuring 3.9 x 2.2 cm (series 2, image 15). Additional simple cysts of the left and right lobes. No gallstones or gallbladder wall thickening. Redemonstrated dilatation of the common bile duct measuring up to 1.2 cm. Pancreas: Unremarkable. No pancreatic ductal dilatation or surrounding inflammatory changes. Spleen: Normal in size without significant abnormality. Adrenals/Urinary Tract: Adrenal glands are unremarkable. Kidneys are normal, without renal calculi, solid lesion, or hydronephrosis. Bladder is unremarkable. Stomach/Bowel: Redemonstrated postoperative findings of partial gastrectomy. Appendix appears normal. No evidence of bowel wall thickening, distention, or inflammatory changes. Vascular/Lymphatic: Aortic atherosclerosis. No  enlarged abdominal or pelvic lymph nodes. Reproductive: No mass or other significant abnormality. Other: No abdominal wall hernia or abnormality. No abdominopelvic ascites. Musculoskeletal: No acute or significant osseous findings. IMPRESSION: 1. Redemonstrated postoperative findings of partial gastrectomy. 2. No evidence of recurrent or metastatic disease in the abdomen or pelvis. 3.  Redemonstrated dilatation of the common bile duct measuring up to 1.2 cm. No gallstones or gallbladder wall thickening. No obstructing lesion visualized to the ampulla correlate with biochemical evidence of cholestasis. 4. Redemonstrated benign hemangioma of the subcapsular anterior right lobe of the liver measuring 3.9 x 2.2 cm. Additional simple cysts of the left and right lobes. 5. Aortic Atherosclerosis (ICD10-I70.0). Electronically Signed   By: Eddie Candle M.D.   On: 08/15/2020 15:26      ASSESSMENT & PLAN:  1. Gastrointestinal stromal tumor (GIST) ([place])   2. Constipation, unspecified constipation type    Pathology was reviewed and discussed with patient daughter. GIST of stomach, status post resection -Tumor size 3.2 cm, high mitotic rate 11/20m2, rare exon 13 Mutation. Patient was recommended to start a[place]which she declined. Surveillance CT images were independently reviewed by me and discussed with patient.  No recurrence.  Recommend repeat CT in 6 months.   Constipation recommend patient to take colace 1027mdaily, if still can not have bowel movement, take Senna.  Online interpretor service was utlized during this encounter.   We spent sufficient time to discuss many aspect of care, questions were answered to patient's satisfaction.   Orders Placed This Encounter  Procedures  . CT Abdomen Pelvis W Contrast    Standing Status:   Future    Standing Expiration Date:   08/23/2021    Order Specific Question:   If indicated for the ordered procedure, I authorize the administration of  contrast media per Radiology protocol    Answer:   Yes    Order Specific Question:   Preferred imaging location?    Answer:   [facility]    Order Specific Question:   Is Oral Contrast requested for this exam?    Answer:   Yes, Per Radiology protocol    All questions were answered. The patient knows to call the clinic with any problems questions or concerns.   Return of visit: 6 months ZhEarlie ServerMD, PhD Hematology Oncology [facility]- [phone]/22/2021

## 2020-08-23 NOTE — Progress Notes (Signed)
Patient here for follow up. Patient reports that she is constipation.

## 2020-09-06 ENCOUNTER — Ambulatory Visit: Payer: Medicare Other | Admitting: Internal Medicine

## 2020-09-12 ENCOUNTER — Other Ambulatory Visit: Payer: Medicare Other

## 2020-09-13 ENCOUNTER — Other Ambulatory Visit: Payer: Self-pay

## 2020-09-13 ENCOUNTER — Ambulatory Visit (INDEPENDENT_AMBULATORY_CARE_PROVIDER_SITE_OTHER): Payer: Medicare Other | Admitting: Internal Medicine

## 2020-09-13 ENCOUNTER — Encounter: Payer: Self-pay | Admitting: Internal Medicine

## 2020-09-13 VITALS — BP 124/70 | HR 56 | Temp 98.1°F | Ht 62.0 in | Wt 120.8 lb

## 2020-09-13 DIAGNOSIS — Z23 Encounter for immunization: Secondary | ICD-10-CM

## 2020-09-13 DIAGNOSIS — C49A0 Gastrointestinal stromal tumor, unspecified site: Secondary | ICD-10-CM

## 2020-09-13 DIAGNOSIS — E1159 Type 2 diabetes mellitus with other circulatory complications: Secondary | ICD-10-CM | POA: Diagnosis not present

## 2020-09-13 DIAGNOSIS — M542 Cervicalgia: Secondary | ICD-10-CM

## 2020-09-13 DIAGNOSIS — E2839 Other primary ovarian failure: Secondary | ICD-10-CM

## 2020-09-13 DIAGNOSIS — R350 Frequency of micturition: Secondary | ICD-10-CM | POA: Diagnosis not present

## 2020-09-13 DIAGNOSIS — D1803 Hemangioma of intra-abdominal structures: Secondary | ICD-10-CM

## 2020-09-13 DIAGNOSIS — I152 Hypertension secondary to endocrine disorders: Secondary | ICD-10-CM | POA: Diagnosis not present

## 2020-09-13 DIAGNOSIS — R32 Unspecified urinary incontinence: Secondary | ICD-10-CM

## 2020-09-13 DIAGNOSIS — K7689 Other specified diseases of liver: Secondary | ICD-10-CM

## 2020-09-13 DIAGNOSIS — K5900 Constipation, unspecified: Secondary | ICD-10-CM

## 2020-09-13 DIAGNOSIS — R1013 Epigastric pain: Secondary | ICD-10-CM

## 2020-09-13 DIAGNOSIS — I10 Essential (primary) hypertension: Secondary | ICD-10-CM

## 2020-09-13 DIAGNOSIS — H6123 Impacted cerumen, bilateral: Secondary | ICD-10-CM

## 2020-09-13 DIAGNOSIS — K838 Other specified diseases of biliary tract: Secondary | ICD-10-CM

## 2020-09-13 DIAGNOSIS — Z1231 Encounter for screening mammogram for malignant neoplasm of breast: Secondary | ICD-10-CM

## 2020-09-13 DIAGNOSIS — G8929 Other chronic pain: Secondary | ICD-10-CM

## 2020-09-13 DIAGNOSIS — R7303 Prediabetes: Secondary | ICD-10-CM

## 2020-09-13 DIAGNOSIS — M545 Low back pain, unspecified: Secondary | ICD-10-CM

## 2020-09-13 DIAGNOSIS — M549 Dorsalgia, unspecified: Secondary | ICD-10-CM

## 2020-09-13 LAB — LIPID PANEL
Cholesterol: 158 mg/dL (ref 0–200)
HDL: 67.9 mg/dL (ref >39.00)
LDL Cholesterol: 78 mg/dL (ref 0–99)
NonHDL: 90.19
Total CHOL/HDL Ratio: 2
Triglycerides: 63 mg/dL (ref 0.0–149.0)
VLDL: 12.6 mg/dL (ref 0.0–40.0)

## 2020-09-13 LAB — HEMOGLOBIN A1C: Hgb A1c MFr Bld: 6.3 % (ref 4.6–6.5)

## 2020-09-13 NOTE — Patient Instructions (Addendum)
Tylenol as needed  Consider pfizer vaccine booster wait 2 weeks to 1 month ([facility] or [address] [address], [place])  Sent home with Rx diapers  We need to restart Gleevec per oncology they want you on this   Ear wax drops Debrox you can buy over the counter to clean ears 5-10 drops daily x 1 week 1x per month

## 2020-09-13 NOTE — Progress Notes (Signed)
Chief Complaint  Patient presents with  . Follow-up  . Labs Only    fasting labs   . Immunizations   F/u used interpreter Leni as pt is from [place]  1. HTN controlled on norvasc 2.5 mg qd and lis 40 mg qd  2. 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 She c/o increased urination at night 4x per day will check urine today she request diapers Rx today size M change 4x per day  5. C/o neck mid and low back pain 9/10 doing PT 2x per week which helps as well as heating pad today 8/10 est. With Dr. Sharlet Salina    Review of Systems  Constitutional: Negative for weight loss.  HENT: Negative for hearing loss.   Eyes: Negative for blurred vision.  Respiratory: Negative for shortness of breath.   Cardiovascular: Negative for chest pain.  Gastrointestinal: Positive for abdominal pain and constipation.  Genitourinary: Positive for frequency.  Musculoskeletal: Positive for back pain, joint pain and neck pain. Negative for falls.  Skin: Negative for rash.  Neurological: Negative for headaches.  Psychiatric/Behavioral: Negative for depression.   Past Medical History:  Diagnosis Date  . Arthritis    left shoulder, right low back and right hip   . Hyperlipidemia   . Hypertension   . UTI (urinary tract infection)    Past Surgical History:  Procedure Laterality Date  . CATARACT EXTRACTION     b/l   . 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 Breast cancer Other    Social History   Socioeconomic History  . Marital status: Divorced    Spouse name: Not on file  . Number of children: Not  on file  . Years of education: Not on file  . Highest education level: Not on file  Occupational History  . Not on file  Tobacco Use  . Smoking status: Never Smoker  . Smokeless tobacco: Never Used  Substance and Sexual Activity  . Alcohol use: Not Currently  . Drug use: Not Currently  . Sexual activity: Not Currently  Other Topics Concern  . Not on file  Social History Narrative   4 daughters    [place] here from [place], [place].    Currently not working and pending insurance as of 06/30/19    From the [employer]    Social Determinants of Health   Financial Resource Strain:   . Difficulty of Paying Living Expenses: Not on file  Food Insecurity:   . Worried About Charity fundraiser in the Last Year: Not on file  . Ran Out of Food in the Last Year: Not on file  Transportation Needs:   . Lack of Transportation (Medical): Not on file  . Lack of Transportation (Non-Medical): Not on file  Physical Activity:   . Days of Exercise per Week: Not on file  . Minutes of Exercise per Session: Not on file  Stress:   . Feeling of Stress : Not on file  Social Connections:   . Frequency of Communication with Friends and Family:  Not on file  . Frequency of Social Gatherings with Friends and Family: Not on file  . Attends Religious Services: Not on file  . Active Member of Clubs or Organizations: Not on file  . Attends Archivist Meetings: Not on file  . Marital Status: Not on file  Intimate Partner Violence:   . Fear of Current or Ex-Partner: Not on file  . Emotionally Abused: Not on file  . Physically Abused: Not on file  . Sexually Abused: Not on file   Current Meds  Medication Sig  . amLODipine (NORVASC) 2.5 MG tablet Take 1 tablet (2.5 mg total) by mouth daily. If BP>130/>80  . Ascorbic Acid (VITAMIN C PO) Take by mouth daily.  Marland Kitchen aspirin EC 81 MG tablet Take 81 mg by mouth daily.  . B Complex Vitamins (VITAMIN B COMPLEX PO) Take by mouth.  . Cholecalciferol (VITAMIN D3) 50  MCG (2000 UT) capsule Take 2,000 Units by mouth daily.  Marland Kitchen imatinib (GLEEVEC) 400 MG tablet Take 1 tablet (400 mg total) by mouth daily. Take with meals and large glass of water.Caution:Chemotherapy.  Marland Kitchen lisinopril (ZESTRIL) 40 MG tablet Take 1 tablet (40 mg total) by mouth daily. D/c lis 20-hctz 25  . metoprolol tartrate (LOPRESSOR) 50 MG tablet Take 1 tablet (50 mg total) by mouth 2 (two) times daily.  . multivitamin-lutein (OCUVITE-LUTEIN) CAPS capsule Take 1 capsule by mouth daily.  . Omega-3 Fatty Acids (OMEGA 3 PO) Take by mouth daily.  . simvastatin (ZOCOR) 20 MG tablet Take 1 tablet (20 mg total) by mouth daily at 6 PM.   Allergies  Allergen Reactions  . Acetaminophen Other (See Comments)    Cold sweats, general malaise  . Hctz [Hydrochlorothiazide]     Hyponatremia   . Tylenol With Codeine #3  [Acetaminophen-Codeine]     Other reaction(s): Unknown   Recent Results (from the past 2160 hour(s))  Comprehensive metabolic panel     Status: Abnormal   Collection Time: 08/15/20 12:59 PM  Result Value Ref Range   Sodium 135 135 - 145 mmol/L   Potassium 4.6 3.5 - 5.1 mmol/L   Chloride 99 98 - 111 mmol/L   CO2 28 22 - 32 mmol/L   Glucose, Bld 101 (H) 70 - 99 mg/dL    Comment: Glucose reference range applies only to samples taken after fasting for at least 8 hours.   BUN 12 8 - 23 mg/dL   Creatinine, Ser 0.83 0.44 - 1.00 mg/dL   Calcium 9.1 8.9 - 10.3 mg/dL   Total Protein 7.5 6.5 - 8.1 g/dL   Albumin 4.2 3.5 - 5.0 g/dL   AST 31 15 - 41 U/L   ALT 35 0 - 44 U/L   Alkaline Phosphatase 47 38 - 126 U/L   Total Bilirubin 1.1 0.3 - 1.2 mg/dL   GFR, Estimated >60 >60 mL/min   Anion gap 8 5 - 15    Comment: Performed at [facility], [address] [place], [place] [address]  CBC with Differential/Platelet     Status: None   Collection Time: 08/15/20 12:59 PM  Result Value Ref Range   WBC 5.6 4.0 - 10.5 K/uL   RBC 4.07 3.87 - 5.11 MIL/uL   Hemoglobin 12.5 12.0 - 15.0 g/dL    HCT 36.6 36 - 46 %   MCV 89.9 80.0 - 100.0 fL   MCH 30.7 26.0 - 34.0 pg   MCHC 34.2 30.0 - 36.0 g/dL   RDW 12.4 11.5 -  15.5 %   Platelets 216 150 - 400 K/uL   nRBC 0.0 0.0 - 0.2 %   Neutrophils Relative % 64 %   Neutro Abs 3.6 1.7 - 7.7 K/uL   Lymphocytes Relative 24 %   Lymphs Abs 1.3 0.7 - 4.0 K/uL   Monocytes Relative 8 %   Monocytes Absolute 0.5 0.1 - 1.0 K/uL   Eosinophils Relative 3 %   Eosinophils Absolute 0.1 0.0 - 0.5 K/uL   Basophils Relative 1 %   Basophils Absolute 0.0 0.0 - 0.1 K/uL   Immature Granulocytes 0 %   Abs Immature Granulocytes 0.02 0.00 - 0.07 K/uL    Comment: Performed at [facility], [address] [place], [place] [address]  Lipid panel     Status: None   Collection Time: 09/13/20 10:07 AM  Result Value Ref Range   Cholesterol 158 0 - 200 mg/dL    Comment: ATP III Classification       Desirable:  < 200 mg/dL               Borderline High:  200 - 239 mg/dL          High:  > = 240 mg/dL   Triglycerides 63.0 0 - 149 mg/dL    Comment: Normal:  <150 mg/dLBorderline High:  150 - 199 mg/dL   HDL 67.90 >39.00 mg/dL   VLDL 12.6 0.0 - 40.0 mg/dL   LDL Cholesterol 78 0 - 99 mg/dL   Total CHOL/HDL Ratio 2     Comment:                Men          Women1/2 Average Risk     3.4          3.3Average Risk          5.0          4.42X Average Risk          9.6          7.13X Average Risk          15.0          11.0                       NonHDL 90.19     Comment: NOTE:  Non-HDL goal should be 30 mg/dL higher than patient's LDL goal (i.e. LDL goal of < 70 mg/dL, would have non-HDL goal of < 100 mg/dL)  Hemoglobin A1c     Status: None   Collection Time: 09/13/20 10:07 AM  Result Value Ref Range   Hgb A1c MFr Bld 6.3 4.6 - 6.5 %    Comment: Glycemic Control Guidelines for People with Diabetes:Non Diabetic:  <6%Goal of Therapy: <7%Additional Action Suggested:  >8%   Urinalysis, Routine w reflex microscopic     Status: Abnormal   Collection Time: 09/13/20 10:07 AM   Result Value Ref Range   Color, Urine YELLOW YELLOW   APPearance CLEAR CLEAR   Specific Gravity, Urine 1.015 1.001 - 1.03   pH 7.0 5.0 - 8.0   Glucose, UA NEGATIVE NEGATIVE   Bilirubin Urine NEGATIVE NEGATIVE   Ketones, ur NEGATIVE NEGATIVE   Hgb urine dipstick NEGATIVE NEGATIVE   Protein, ur NEGATIVE NEGATIVE   Nitrite NEGATIVE NEGATIVE   Leukocytes,Ua 1+ (A) NEGATIVE   WBC, UA 0-5 0 - 5 /HPF   RBC / HPF 0-2 0 - 2 /HPF   Squamous  Epithelial / LPF 0-5 < OR = 5 /HPF   Bacteria, UA NONE SEEN NONE SEEN /HPF   Hyaline Cast NONE SEEN NONE SEEN /LPF  Microalbumin / creatinine urine ratio     Status: None   Collection Time: 09/13/20 10:07 AM  Result Value Ref Range   Creatinine, Urine 122 20 - 275 mg/dL   Microalb, Ur 1.4 mg/dL    Comment: Reference Range Not established    Microalb Creat Ratio 11 <30 mcg/mg creat    Comment: . The ADA defines abnormalities in albumin excretion as follows: Marland Kitchen Albuminuria Category        Result (mcg/mg creatinine) . Normal to Mildly increased   <30 Moderately increased         30-299  Severely increased           > OR = 300 . The ADA recommends that at least two of three specimens collected within a 3-6 month period be abnormal before considering a patient to be within a diagnostic category.   Urine Culture     Status: Abnormal   Collection Time: 09/13/20 10:07 AM   Specimen: Urine  Result Value Ref Range   MICRO NUMBER: [id number]    SPECIMEN QUALITY: Adequate    Sample Source URINE    STATUS: FINAL    ISOLATE 1: Escherichia coli (A)     Comment: 10,000-49,000 CFU/mL of Escherichia coli      Susceptibility   Escherichia coli - URINE CULTURE, REFLEX    AMOX/CLAVULANIC 8 Sensitive     AMPICILLIN >=32 Resistant     AMPICILLIN/SULBACTAM 16 Intermediate     CEFAZOLIN* <=4 Not Reportable      * For infections other than uncomplicated UTIcaused by E. coli, K. pneumoniae or P. mirabilis:Cefazolin is resistant if MIC > or = 8  mcg/mL.(Distinguishing susceptible versus intermediatefor isolates with MIC < or = 4 mcg/mL requiresadditional testing.)For uncomplicated UTI caused by E. coli,K. pneumoniae or P. mirabilis: Cefazolin issusceptible if MIC <32 mcg/mL and predictssusceptible to the oral agents cefaclor, cefdinir,cefpodoxime, cefprozil, cefuroxime, cephalexinand loracarbef.    CEFEPIME <=1 Sensitive     CEFTRIAXONE <=1 Sensitive     CIPROFLOXACIN <=0.25 Sensitive     LEVOFLOXACIN <=0.12 Sensitive     ERTAPENEM <=0.5 Sensitive     GENTAMICIN <=1 Sensitive     IMIPENEM <=0.25 Sensitive     NITROFURANTOIN <=16 Sensitive     PIP/TAZO <=4 Sensitive     TOBRAMYCIN <=1 Sensitive     TRIMETH/SULFA* <=20 Sensitive      * For infections other than uncomplicated UTIcaused by E. coli, K. pneumoniae or P. mirabilis:Cefazolin is resistant if MIC > or = 8 mcg/mL.(Distinguishing susceptible versus intermediatefor isolates with MIC < or = 4 mcg/mL requiresadditional testing.)For uncomplicated UTI caused by E. coli,K. pneumoniae or P. mirabilis: Cefazolin issusceptible if MIC <32 mcg/mL and predictssusceptible to the oral agents cefaclor, cefdinir,cefpodoxime, cefprozil, cefuroxime, cephalexinand loracarbef.Legend:S = Susceptible  I = IntermediateR = Resistant  NS = Not susceptible* = Not tested  NR = Not reported**NN = See antimicrobic comments   Objective  Body mass index is 22.09 kg/m. Wt Readings from Last 3 Encounters:  09/13/20 120 lb 12.8 oz (54.8 kg)  08/23/20 122 lb 14.4 oz (55.7 kg)  07/19/20 128 lb (58.1 kg)   Temp Readings from Last 3 Encounters:  09/13/20 98.1 F (36.7 C) (Oral)  08/23/20 (!) 97.3 F (36.3 C)  04/12/20 98.1 F (36.7 C) (Oral)   BP Readings from Last 3  Encounters:  09/13/20 124/70  08/23/20 114/68  07/19/20 112/72   Pulse Readings from Last 3 Encounters:  09/13/20 (!) 56  08/23/20 (!) 56  07/19/20 60    Physical Exam Vitals and nursing note reviewed.  Constitutional:       Appearance: Normal appearance. She is well-developed and well-groomed.  HENT:     Head: Normocephalic and atraumatic.  Eyes:     Conjunctiva/sclera: Conjunctivae normal.     Pupils: Pupils are equal, round, and reactive to light.  Cardiovascular:     Rate and Rhythm: Normal rate and regular rhythm.     Heart sounds: Normal heart sounds. No murmur heard.   Pulmonary:     Effort: Pulmonary effort is normal.     Breath sounds: Normal breath sounds.  Abdominal:     Tenderness: There is no abdominal tenderness.  Skin:    General: Skin is warm and dry.  Neurological:     General: No focal deficit present.     Mental Status: She is alert and oriented to person, place, and time. Mental status is at baseline.     Gait: Gait normal.  Psychiatric:        Attention and Perception: Attention and perception normal.        Mood and Affect: Mood and affect normal.        Speech: Speech normal.        Behavior: Behavior normal. Behavior is cooperative.        Thought Content: Thought content normal.        Cognition and Memory: Cognition and memory normal.        Judgment: Judgment normal.     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 Sharlet Salina   Cerumen impaction with pain  Consented and tolerated ear lavage with currette and all wax removed    Hm  Flu shot given today   covid 2/2 pfizer consider booster rec Tdap, shingrix, prevnar if not had  She does report she had pna 23 vaccine 04/30/19 but no record  mammo referredwill ask daughter [name] to schedule  Consider cologuard when she gets insurance  Out of age window pap  Consider DEXA in future will order for pt to schedule Will need referral to eye MD when gets insurance right eye bothersome to her   Former PCP Dr. Marguerita Beards in [place] try to get records GIST tumor 2/16/21KIT exon 13 detected + [facility] Dr. Mont Dutton  H/o Dr. Tasia Catchings     Provider: Dr. Olivia Mackie McLean-Scocuzza-Internal Medicine

## 2020-09-15 LAB — URINALYSIS, ROUTINE W REFLEX MICROSCOPIC
Bacteria, UA: NONE SEEN /HPF
Bilirubin Urine: NEGATIVE
Glucose, UA: NEGATIVE
Hgb urine dipstick: NEGATIVE
Hyaline Cast: NONE SEEN /LPF
Ketones, ur: NEGATIVE
Nitrite: NEGATIVE
Protein, ur: NEGATIVE
Specific Gravity, Urine: 1.015 (ref 1.001–1.03)
pH: 7 (ref 5.0–8.0)

## 2020-09-15 LAB — MICROALBUMIN / CREATININE URINE RATIO
Creatinine, Urine: 122 mg/dL (ref 20–275)
Microalb Creat Ratio: 11 mcg/mg creat (ref <30)
Microalb, Ur: 1.4 mg/dL

## 2020-09-15 LAB — URINE CULTURE
MICRO NUMBER:: 11196970
SPECIMEN QUALITY:: ADEQUATE

## 2020-09-16 ENCOUNTER — Other Ambulatory Visit: Payer: Self-pay | Admitting: Internal Medicine

## 2020-09-16 DIAGNOSIS — N3000 Acute cystitis without hematuria: Secondary | ICD-10-CM

## 2020-09-16 MED ORDER — AMOXICILLIN-POT CLAVULANATE 875-125 MG PO TABS: 1.0000 | ORAL_TABLET | Freq: Two times a day (BID) | ORAL | 0 refills | Status: DC

## 2020-09-17 ENCOUNTER — Encounter: Payer: Self-pay | Admitting: Internal Medicine

## 2020-09-17 DIAGNOSIS — K5900 Constipation, unspecified: Secondary | ICD-10-CM | POA: Insufficient documentation

## 2020-09-17 DIAGNOSIS — M542 Cervicalgia: Secondary | ICD-10-CM | POA: Insufficient documentation

## 2020-09-17 DIAGNOSIS — K838 Other specified diseases of biliary tract: Secondary | ICD-10-CM | POA: Insufficient documentation

## 2020-10-16 ENCOUNTER — Telehealth: Payer: Self-pay | Admitting: Internal Medicine

## 2020-10-16 NOTE — Telephone Encounter (Signed)
err

## 2020-10-21 NOTE — Addendum Note (Signed)
Addended by: Orland Mustard on: 10/21/2020 10:22 PM   Modules accepted: Orders

## 2020-10-23 IMAGING — CT CT ABD-PELV W/ CM
2 of 5 series · 15 of 46 positions shown, 17 images · IV contrast (omnipaque)
Comparison: 08/18/2019

CLINICAL DATA: Upper abdominal pain. History of GI stromal tumor
resected in [REDACTED].

EXAM:
CT ABDOMEN AND PELVIS WITH CONTRAST
TECHNIQUE: Multidetector CT imaging of the abdomen and pelvis was performed
using the standard protocol following bolus administration of
intravenous contrast.
CONTRAST:  100mL OMNIPAQUE IOHEXOL 300 MG/ML  SOLN

[Series 2: axial st · axial · 0.64mm/px · z∈[+379,+759]mm · 12 of 86 slices shown, 14 images]
[im 5/86  soft-tissue]
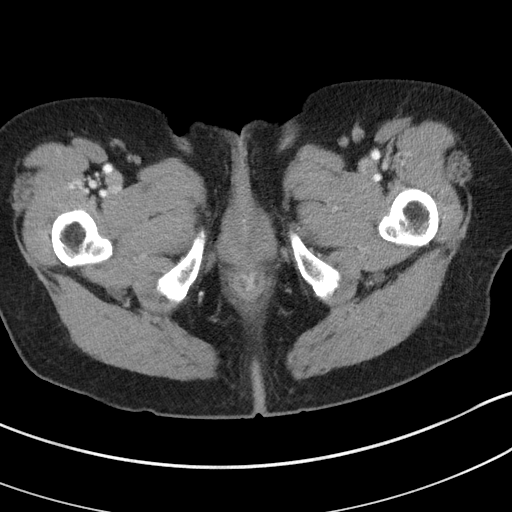
[im 5/86  bone]
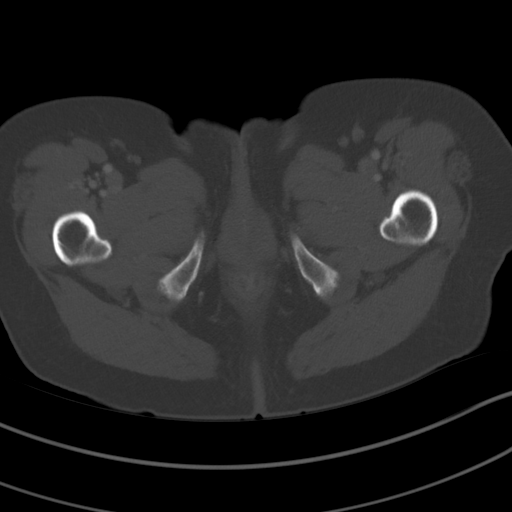
[im 15/86  soft-tissue]
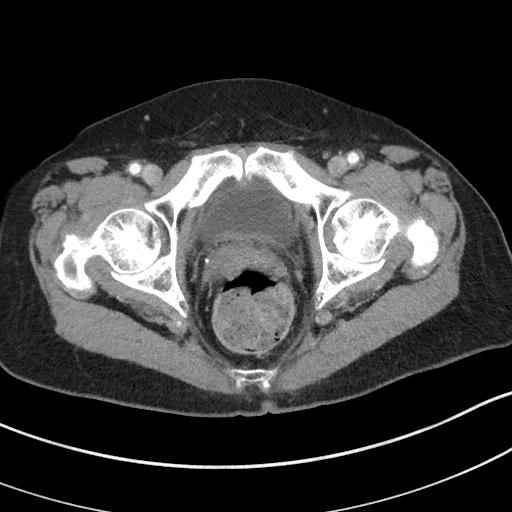
[im 19/86  soft-tissue]
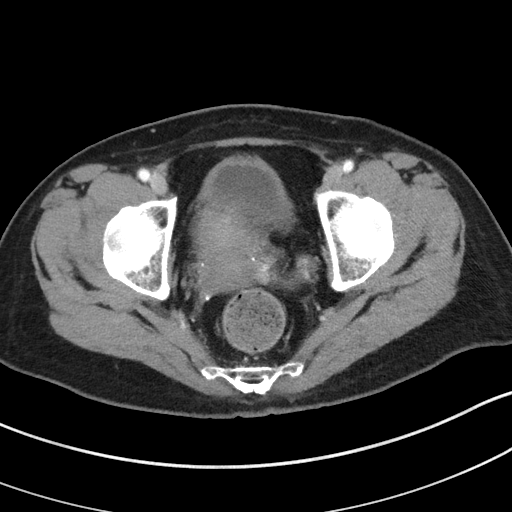
[im 24/86  soft-tissue]
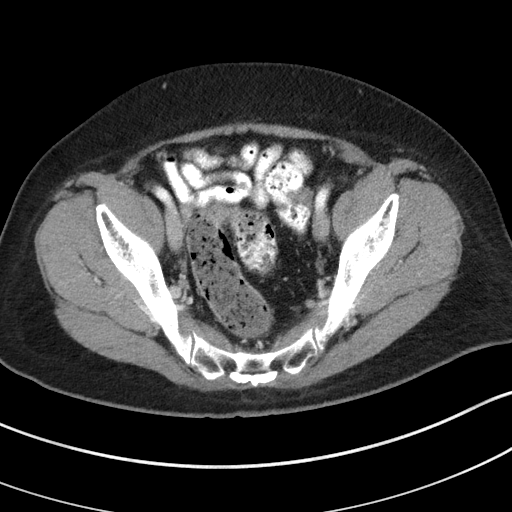
[im 34/86  soft-tissue]
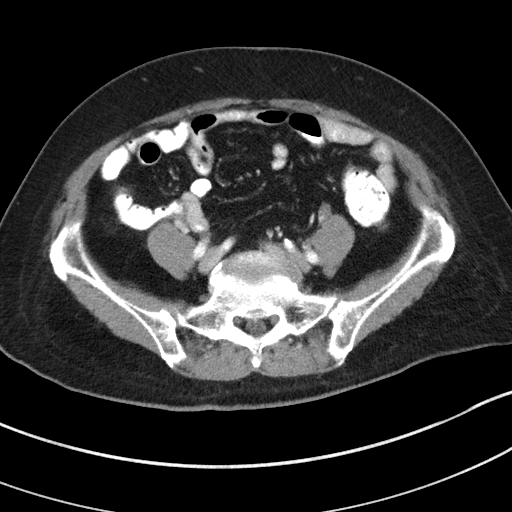
[im 38/86  soft-tissue]
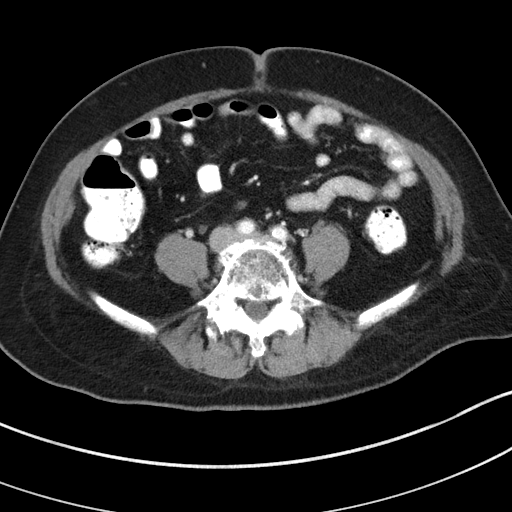
[im 48/86  soft-tissue]
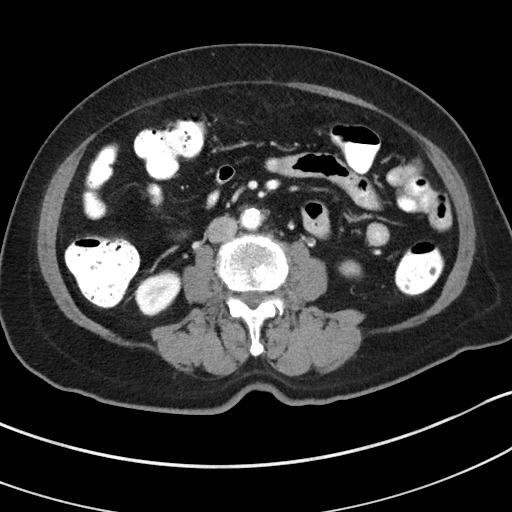
[im 52/86  soft-tissue]
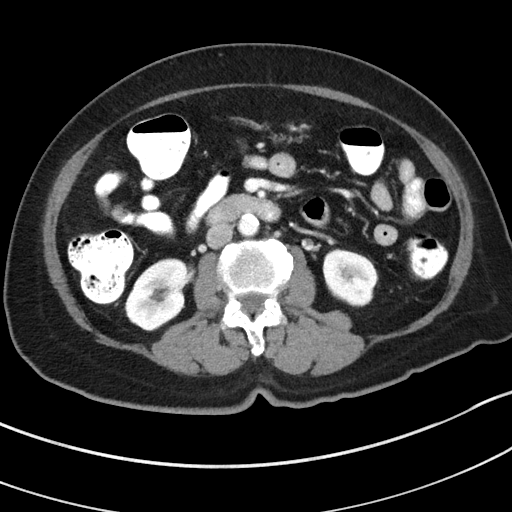
[im 62/86  soft-tissue]
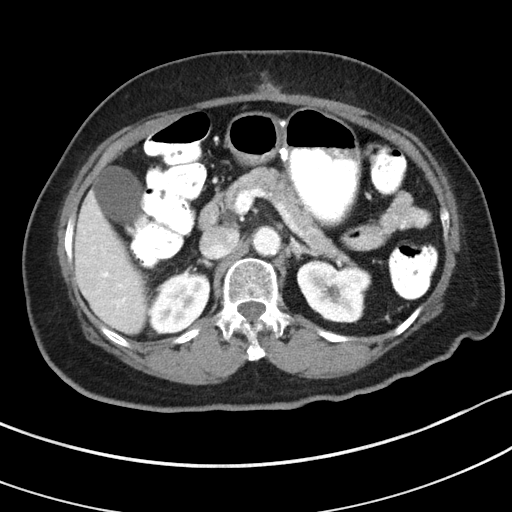
[im 62/86  bone]
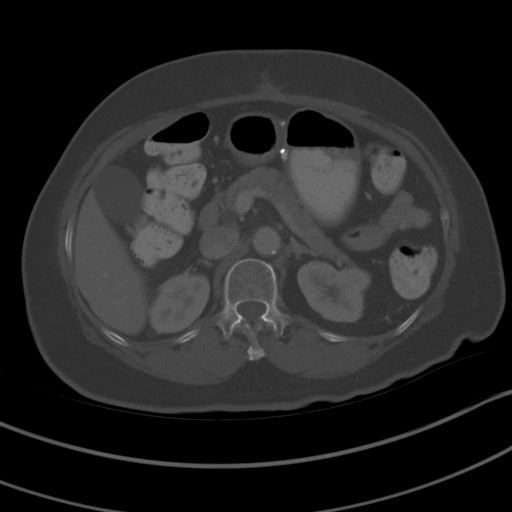
[im 67/86  soft-tissue]
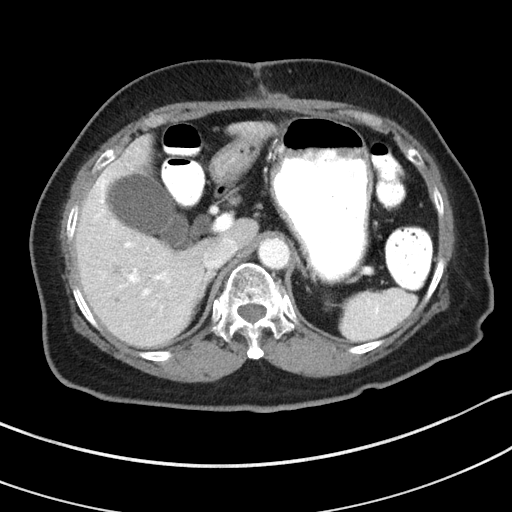
[im 71/86  soft-tissue]
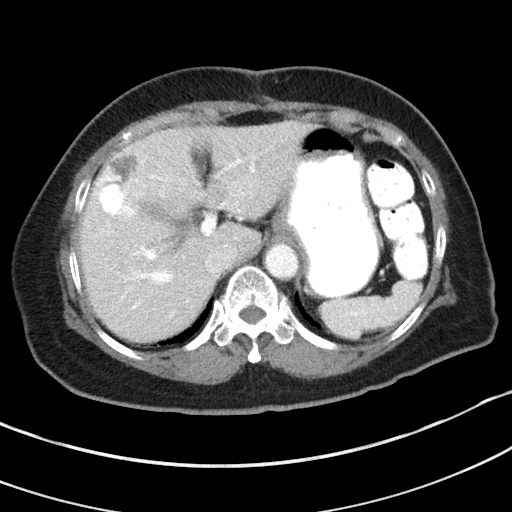
[im 81/86  soft-tissue]
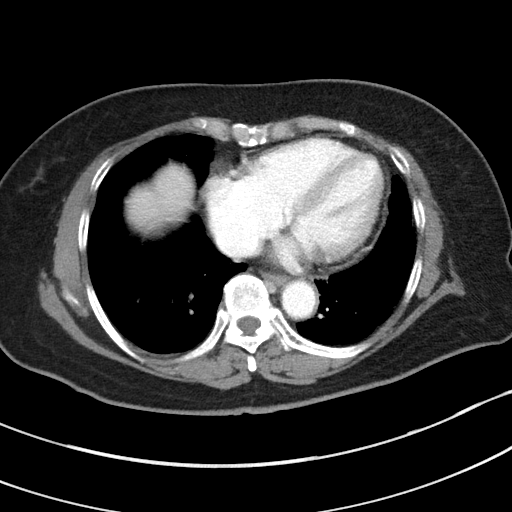

[Series 5: coronal st · coronal · 0.62mm/px · 3 of 80 slices shown]
[im 27/80  soft-tissue]
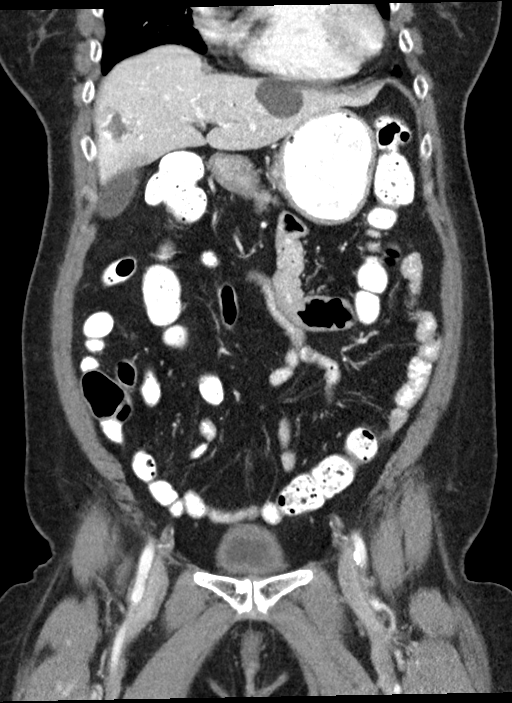
[im 36/80  soft-tissue]
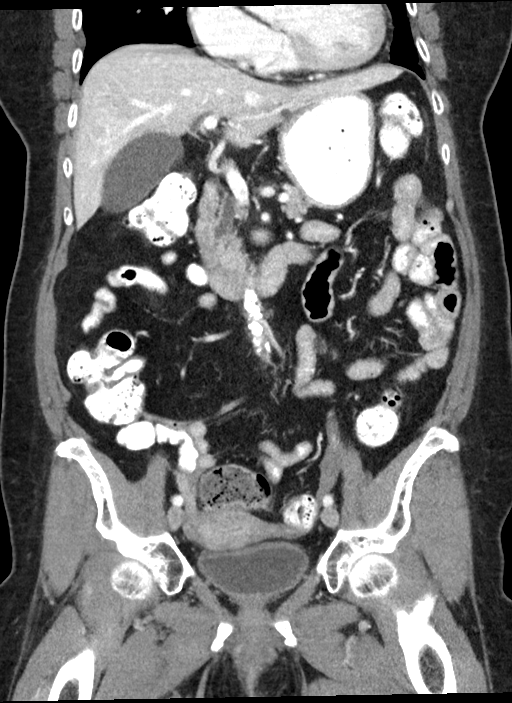
[im 44/80  soft-tissue]
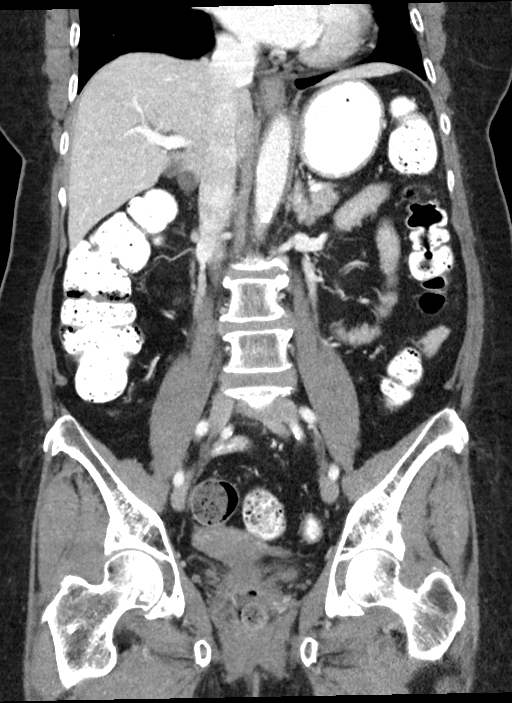

[15 of 46 positions shown; findings below may reference images not displayed]

FINDINGS: Lower chest: Left base scarring with mild bronchiectasis, similar.
Mild to moderate cardiomegaly, without pericardial or pleural
effusion.

Hepatobiliary: Pericholecystic right hepatic lobe hemangioma, to
cm. Scattered hepatic cysts. A too small to characterize inferior
right hepatic lobe lesion is similar. Subcentimeter right hepatic
lobe hyperenhancing lesion is likely a hemangioma on [DATE].

Normal gallbladder. No intrahepatic biliary duct dilatation. The
common duct is upper normal for age, 8 mm. No choledocholithiasis.

Pancreas: Normal, without mass or ductal dilatation.

Spleen: Normal in size, without focal abnormality.

Adrenals/Urinary Tract: Normal adrenal glands. Right renal too small
to characterize lesions are most likely cysts. Normal left kidney
and urinary bladder.

Stomach/Bowel: Interval resection of previously described gastric
body lesion. Moderate stool within the rectum and sigmoid. Normal
terminal ileum and appendix. Normal small bowel.

Vascular/Lymphatic: Aortic and branch vessel atherosclerosis. No
abdominopelvic adenopathy.

Reproductive: Normal uterus and adnexa.

Other: No significant free fluid.  No free intraperitoneal air.

Musculoskeletal: Scattered pelvic sclerotic lesions are likely bone
islands. Disc bulges at L3-4 and L4-5.
IMPRESSION: 1. No acute process in the abdomen or pelvis.
2. Interval resection of previously described gastric body lesion.
3. Possible constipation and fecal impaction.
4. Aortic Atherosclerosis ([id number]).

## 2020-11-05 ENCOUNTER — Telehealth: Payer: Self-pay

## 2020-11-05 NOTE — Telephone Encounter (Signed)
Thanks for the update. She will keep her current follow up appt date. Thanks.   Cc Dr.McLean.

## 2020-11-05 NOTE — Progress Notes (Signed)
Spoke to patient's daughter Darel Hong who states that patient does NOT want to take Gleevac

## 2020-11-05 NOTE — Telephone Encounter (Signed)
-----   Message from Rickard Patience, MD sent at 11/04/2020  8:46 PM EST ----- See below. Please contact her or daughter to see if she has changed her mind and wants to take the medicine. If  yes, I would like to move her appt up. Prefer her to come with her daughter for discussion.  ----- Message ----- From: McLean-Scocuzza, Pasty Spillers, MD Sent: 09/18/2020   7:24 AM EST To: Rickard Patience, MD  Discussed with pt with interpreter which she probably needs if family is not going to be there and no family was there which ive encouraged at her appt   Pt with interpreter agreed to be on Gleevac  ----- Message ----- From: Rickard Patience, MD Sent: 09/17/2020  10:42 PM EST To: Pasty Spillers McLean-Scocuzza, MD  Can you clarify? What medication daughter has question about? She was offered adjuvant gleevec and pt declined.  ----- Message ----- From: McLean-Scocuzza, Pasty Spillers, MD Sent: 09/13/2020  10:02 AM EST To: Rickard Patience, MD  Also daughter Darel Hong needs to know which pharmacy sent  Thanks Or if [facility] long can they mail?

## 2020-11-05 NOTE — Telephone Encounter (Signed)
FYI Dr. Shirlee Latch

## 2020-11-05 NOTE — Telephone Encounter (Signed)
Called and spoke to patient's daughter Darel Hong who states that Chelsey Lynch has not changed her mind and does NOT wish to take Gleevac at this time.

## 2020-11-05 NOTE — Telephone Encounter (Signed)
Noted  

## 2020-11-15 ENCOUNTER — Encounter: Payer: Self-pay | Admitting: Internal Medicine

## 2020-11-15 ENCOUNTER — Telehealth: Payer: Medicare Other | Admitting: Family

## 2020-11-15 ENCOUNTER — Other Ambulatory Visit: Payer: Self-pay

## 2020-11-15 ENCOUNTER — Telehealth (INDEPENDENT_AMBULATORY_CARE_PROVIDER_SITE_OTHER): Payer: Medicare Other | Admitting: Internal Medicine

## 2020-11-15 VITALS — Ht 62.0 in | Wt 120.0 lb

## 2020-11-15 DIAGNOSIS — J320 Chronic maxillary sinusitis: Secondary | ICD-10-CM | POA: Diagnosis not present

## 2020-11-15 DIAGNOSIS — J0100 Acute maxillary sinusitis, unspecified: Secondary | ICD-10-CM | POA: Diagnosis not present

## 2020-11-15 DIAGNOSIS — H04203 Unspecified epiphora, bilateral lacrimal glands: Secondary | ICD-10-CM | POA: Diagnosis not present

## 2020-11-15 DIAGNOSIS — L299 Pruritus, unspecified: Secondary | ICD-10-CM | POA: Diagnosis not present

## 2020-11-15 DIAGNOSIS — B9689 Other specified bacterial agents as the cause of diseases classified elsewhere: Secondary | ICD-10-CM

## 2020-11-15 MED ORDER — AMOXICILLIN-POT CLAVULANATE 875-125 MG PO TABS: 1.0000 | ORAL_TABLET | Freq: Two times a day (BID) | ORAL | 0 refills | Status: DC

## 2020-11-15 MED ORDER — CLINDAMYCIN HCL 300 MG PO CAPS: 300.0000 mg | ORAL_CAPSULE | Freq: Two times a day (BID) | ORAL | 0 refills | Status: DC

## 2020-11-15 MED ORDER — PREDNISONE 20 MG PO TABS: 20.0000 mg | ORAL_TABLET | Freq: Every day | ORAL | 0 refills | Status: DC

## 2020-11-15 MED ORDER — OLOPATADINE HCL 0.2 % OP SOLN: 1.0000 [drp] | Freq: Every day | OPHTHALMIC | 1 refills | Status: DC

## 2020-11-15 NOTE — Patient Instructions (Signed)
Chelsey Lynch can also take Tylenol as needed  If benadryl not effective as needed zyrtec, xyzal, claritin or allegra   Sinusitis, Adult Sinusitis is inflammation of your sinuses. Sinuses are hollow spaces in the bones around your face. Your sinuses are located:  Around your eyes.  In the middle of your forehead.  Behind your nose.  In your cheekbones. Mucus normally drains out of your sinuses. When your nasal tissues become inflamed or swollen, mucus can become trapped or blocked. This allows bacteria, viruses, and fungi to grow, which leads to infection. Most infections of the sinuses are caused by a virus. Sinusitis can develop quickly. It can last for up to 4 weeks (acute) or for more than 12 weeks (chronic). Sinusitis often develops after a cold. What are the causes? This condition is caused by anything that creates swelling in the sinuses or stops mucus from draining. This includes:  Allergies.  Asthma.  Infection from bacteria or viruses.  Deformities or blockages in your nose or sinuses.  Abnormal growths in the nose (nasal polyps).  Pollutants, such as chemicals or irritants in the air.  Infection from fungi (rare). What increases the risk? You are more likely to develop this condition if you:  Have a weak body defense system (immune system).  Do a lot of swimming or diving.  Overuse nasal sprays.  Smoke. What are the signs or symptoms? The main symptoms of this condition are pain and a feeling of pressure around the affected sinuses. Other symptoms include:  Stuffy nose or congestion.  Thick drainage from your nose.  Swelling and warmth over the affected sinuses.  Headache.  Upper toothache.  A cough that may get worse at night.  Extra mucus that collects in the throat or the back of the nose (postnasal drip).  Decreased sense of smell and taste.  Fatigue.  A fever.  Sore throat.  Bad breath. How is this diagnosed? This condition is diagnosed  based on:  Your symptoms.  Your medical history.  A physical exam.  Tests to find out if your condition is acute or chronic. This may include: ? Checking your nose for nasal polyps. ? Viewing your sinuses using a device that has a light (endoscope). ? Testing for allergies or bacteria. ? Imaging tests, such as an MRI or CT scan. In rare cases, a bone biopsy may be done to rule out more serious types of fungal sinus disease. How is this treated? Treatment for sinusitis depends on the cause and whether your condition is chronic or acute.  If caused by a virus, your symptoms should go away on their own within 10 days. You may be given medicines to relieve symptoms. They include: ? Medicines that shrink swollen nasal passages (topical intranasal decongestants). ? Medicines that treat allergies (antihistamines). ? A spray that eases inflammation of the nostrils (topical intranasal corticosteroids). ? Rinses that help get rid of thick mucus in your nose (nasal saline washes).  If caused by bacteria, your health care provider may recommend waiting to see if your symptoms improve. Most bacterial infections will get better without antibiotic medicine. You may be given antibiotics if you have: ? A severe infection. ? A weak immune system.  If caused by narrow nasal passages or nasal polyps, you may need to have surgery. Follow these instructions at home: Medicines  Take, use, or apply over-the-counter and prescription medicines only as told by your health care provider. These may include nasal sprays.  If you were prescribed  an antibiotic medicine, take it as told by your health care provider. Do not stop taking the antibiotic even if you start to feel better. Hydrate and humidify  Drink enough fluid to keep your urine pale yellow. Staying hydrated will help to thin your mucus.  Use a cool mist humidifier to keep the humidity level in your home above 50%.  Inhale steam for 10-15  minutes, 3-4 times a day, or as told by your health care provider. You can do this in the bathroom while a hot shower is running.  Limit your exposure to cool or dry air.   Rest  Rest as much as possible.  Sleep with your head raised (elevated).  Make sure you get enough sleep each night. General instructions  Apply a warm, moist washcloth to your face 3-4 times a day or as told by your health care provider. This will help with discomfort.  Wash your hands often with soap and water to reduce your exposure to germs. If soap and water are not available, use hand sanitizer.  Do not smoke. Avoid being around people who are smoking (secondhand smoke).  Keep all follow-up visits as told by your health care provider. This is important.   Contact a health care provider if:  You have a fever.  Your symptoms get worse.  Your symptoms do not improve within 10 days. Get help right away if:  You have a severe headache.  You have persistent vomiting.  You have severe pain or swelling around your face or eyes.  You have vision problems.  You develop confusion.  Your neck is stiff.  You have trouble breathing. Summary  Sinusitis is soreness and inflammation of your sinuses. Sinuses are hollow spaces in the bones around your face.  This condition is caused by nasal tissues that become inflamed or swollen. The swelling traps or blocks the flow of mucus. This allows bacteria, viruses, and fungi to grow, which leads to infection.  If you were prescribed an antibiotic medicine, take it as told by your health care provider. Do not stop taking the antibiotic even if you start to feel better.  Keep all follow-up visits as told by your health care provider. This is important. This information is not intended to replace advice given to you by your health care provider. Make sure you discuss any questions you have with your health care provider. Document Revised: 03/21/2018 Document Reviewed:  03/21/2018 Elsevier Patient Education  2021 [employer].

## 2020-11-15 NOTE — Progress Notes (Signed)

## 2020-11-15 NOTE — Progress Notes (Signed)
Virtual Visit via Video Note  I connected with Chelsey Lynch  on 11/15/20 at  9:18 AM EST by a video enabled telemedicine application and verified that I am speaking with the correct person using two identifiers.  Location patient: home, [place] Location provider:work or home office Persons participating in the virtual visit: patient, provider Daughter [name]  I discussed the limitations of evaluation and management by telemedicine and the availability of in person appointments. The patient expressed understanding and agreed to proceed.   HPI: 1. Left max sinus pain since 09/2020 when tooth pulled and runny nose and watery eyes and itching benadryl qhs helps left eye is painful too she had 6 front teeth pulled and had dental f/u 10/2020 and was on amoxil q4 hours x 30 days per daughter and finished this  ROS: See pertinent positives and negatives per HPI.  Past Medical History:  Diagnosis Date  . Arthritis    left shoulder, right low back and right hip   . Hyperlipidemia   . Hypertension   . UTI (urinary tract infection)     Past Surgical History:  Procedure Laterality Date  . CATARACT EXTRACTION     b/l   . 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  amLODipine (NORVASC) 2.5 MG tablet, Take 1 tablet (2.5 mg total) by mouth daily. If BP>130/>80, Disp: 90 tablet, Rfl: 3 .  Ascorbic Acid (VITAMIN C PO), Take by mouth daily., Disp: , Rfl:  .  aspirin EC 81 MG tablet, Take 81 mg by mouth daily., Disp: , Rfl:  .  B Complex Vitamins (VITAMIN B COMPLEX PO), Take by mouth., Disp: , Rfl:  .  Cholecalciferol (VITAMIN D3) 50 MCG (2000 UT) capsule, Take 2,000 Units by mouth daily., Disp: , Rfl:  .  clindamycin (CLEOCIN) 300 MG capsule, Take 1 capsule (300 mg total) by mouth in the morning and at bedtime. X 7-10 days, Disp: 20 capsule, Rfl: 0 .  imatinib (GLEEVEC) 400 MG tablet, Take 1 tablet (400 mg  total) by mouth daily. Take with meals and large glass of water.Caution:Chemotherapy., Disp: 30 tablet, Rfl: 1 .  lisinopril (ZESTRIL) 40 MG tablet, Take 1 tablet (40 mg total) by mouth daily. D/c lis 20-hctz 25, Disp: 90 tablet, Rfl: 3 .  metoprolol tartrate (LOPRESSOR) 50 MG tablet, Take 1 tablet (50 mg total) by mouth 2 (two) times daily., Disp: 180 tablet, Rfl: 3 .  multivitamin-lutein (OCUVITE-LUTEIN) CAPS capsule, Take 1 capsule by mouth daily., Disp: , Rfl:  .  Olopatadine HCl 0.2 % SOLN, Apply 1 drop to eye daily., Disp: 2.5 mL, Rfl: 1 .  Omega-3 Fatty Acids (OMEGA 3 PO), Take by mouth daily., Disp: , Rfl:  .  predniSONE (DELTASONE) 20 MG tablet, Take 1 tablet (20 mg total) by mouth daily with breakfast., Disp: 7 tablet, Rfl: 0 .  simvastatin (ZOCOR) 20 MG tablet, Take 1 tablet (20 mg total) by mouth daily at 6 PM., Disp: 90 tablet, Rfl: 1  EXAM:  VITALS per patient if applicable:  GENERAL: alert, oriented, appears well and in no acute distress  HEENT: atraumatic, conjunttiva clear, no obvious abnormalities on inspection of external nose and ears  NECK: normal movements of the head and neck  LUNGS: on inspection no signs of respiratory distress, breathing rate appears normal, no obvious gross SOB, gasping or wheezing  CV: no obvious cyanosis  MS: moves all  visible extremities without noticeable abnormality  PSYCH/NEURO: pleasant and cooperative, no obvious depression or anxiety, speech and thought processing grossly intact  ASSESSMENT AND PLAN:  Discussed the following assessment and plan:  Acute non-recurrent maxillary sinusitis - Plan: Ambulatory referral to ENT, predniSONE (DELTASONE) 20 MG tablet, clindamycin (CLEOCIN) 300 MG capsule  Left maxillary sinusitis - Plan: Ambulatory referral to ENT, predniSONE (DELTASONE) 20 MG tablet, clindamycin (CLEOCIN) 300 MG capsule  Watery eyes - Plan: Olopatadine HCl 0.2 % SOLN  Itching - Plan: predniSONE (DELTASONE) 20 MG  tablet F/u dental and ENT ENT will need blood allergy testing   -we discussed possible serious and likely etiologies, options for evaluation and workup, limitations of telemedicine visit vs in person visit, treatment, treatment risks and precautions.   I discussed the assessment and treatment plan with the patient. The patient was provided an opportunity to ask questions and all were answered. The patient agreed with the plan and demonstrated an understanding of the instructions.     Nino Glow McLean-Scocuzza, MD

## 2020-11-15 NOTE — Progress Notes (Signed)
Runny nose, and had teeth pull 11/29. Facial pain around the nose and eyes, headache as well.   Symptoms on going since the surgery.

## 2020-12-24 ENCOUNTER — Ambulatory Visit (INDEPENDENT_AMBULATORY_CARE_PROVIDER_SITE_OTHER): Payer: Medicare Other

## 2020-12-24 VITALS — Ht 62.0 in | Wt 120.0 lb

## 2020-12-24 DIAGNOSIS — Z0000 Encounter for general adult medical examination without abnormal findings: Secondary | ICD-10-CM

## 2020-12-24 DIAGNOSIS — Z748 Other problems related to care provider dependency: Secondary | ICD-10-CM | POA: Diagnosis not present

## 2020-12-24 NOTE — Patient Instructions (Addendum)
Chelsey Lynch , Thank you for taking time to come for your Medicare Wellness Visit. I appreciate your ongoing commitment to your health goals. Please review the following plan we discussed and let me know if I can assist you in the future.   These are the goals we discussed: Goals    . Wear glasses daily       This is a list of the screening recommended for you and due dates:  Health Maintenance  Topic Date Due  . Tetanus Vaccine  Never done  . DEXA scan (bone density measurement)  Never done  . Pneumonia vaccines (1 of 2 - PCV13) Never done  . COVID-19 Vaccine (4 - Booster for Pfizer series) 05/01/2021  . Flu Shot  Completed  .  Hepatitis C: One time screening is recommended by Center for Disease Control  (CDC) for  adults born from [birth year] through 1967.   Completed    Immunizations Immunization History  Administered Date(s) Administered  . Fluad Quad(high Dose 65+) 07/13/2019, 09/13/2020  . Influenza-Unspecified 09/02/2019  . PFIZER(Purple Top)SARS-COV-2 Vaccination 03/13/2020, 04/03/2020, 11/01/2020   Keep all routine maintenance appointments.   Follow up 03/13/21 @ 9:00  Advanced directives: mailed per patient request.   Follow up in one year for your annual wellness visit    Preventive Care 65 Years and Older, Female Preventive care refers to lifestyle choices and visits with your health care provider that can promote health and wellness. What does preventive care include?  A yearly physical exam. This is also called an annual well check.  Dental exams once or twice a year.  Routine eye exams. Ask your health care provider how often you should have your eyes checked.  Personal lifestyle choices, including:  Daily care of your teeth and gums.  Regular physical activity.  Eating a healthy diet.  Avoiding tobacco and drug use.  Limiting alcohol use.  Practicing safe sex.  Taking low-dose aspirin every day.  Taking vitamin and mineral supplements as recommended  by your health care provider. What happens during an annual well check? The services and screenings done by your health care provider during your annual well check will depend on your age, overall health, lifestyle risk factors, and family history of disease. Counseling  Your health care provider may ask you questions about your:  Alcohol use.  Tobacco use.  Drug use.  Emotional well-being.  Home and relationship well-being.  Sexual activity.  Eating habits.  History of falls.  Memory and ability to understand (cognition).  Work and work Statistician.  Reproductive health. Screening  You may have the following tests or measurements:  Height, weight, and BMI.  Blood pressure.  Lipid and cholesterol levels. These may be checked every 5 years, or more frequently if you are over 42 years old.  Skin check.  Lung cancer screening. You may have this screening every year starting at age 52 if you have a 30-pack-year history of smoking and currently smoke or have quit within the past 15 years.  Fecal occult blood test (FOBT) of the stool. You may have this test every year starting at age 57.  Flexible sigmoidoscopy or colonoscopy. You may have a sigmoidoscopy every 5 years or a colonoscopy every 10 years starting at age 56.  Hepatitis C blood test.  Hepatitis B blood test.  Sexually transmitted disease (STD) testing.  Diabetes screening. This is done by checking your blood sugar (glucose) after you have not eaten for a while (fasting). You may have  this done every 1-3 years.  Bone density scan. This is done to screen for osteoporosis. You may have this done starting at age 61.  Mammogram. This may be done every 1-2 years. Talk to your health care provider about how often you should have regular mammograms. Talk with your health care provider about your test results, treatment options, and if necessary, the need for more tests. Vaccines  Your health care provider may  recommend certain vaccines, such as:  Influenza vaccine. This is recommended every year.  Tetanus, diphtheria, and acellular pertussis (Tdap, Td) vaccine. You may need a Td booster every 10 years.  Zoster vaccine. You may need this after age 61.  Pneumococcal 13-valent conjugate (PCV13) vaccine. One dose is recommended after age 15.  Pneumococcal polysaccharide (PPSV23) vaccine. One dose is recommended after age 71. Talk to your health care provider about which screenings and vaccines you need and how often you need them. This information is not intended to replace advice given to you by your health care provider. Make sure you discuss any questions you have with your health care provider. Document Released: 11/15/2015 Document Revised: 07/08/2016 Document Reviewed: 08/20/2015 Elsevier Interactive Patient Education  2017 [employer] Prevention in the Home Falls can cause injuries. They can happen to people of all ages. There are many things you can do to make your home safe and to help prevent falls. What can I do on the outside of my home?  Regularly fix the edges of walkways and driveways and fix any cracks.  Remove anything that might make you trip as you walk through a door, such as a raised step or threshold.  Trim any bushes or trees on the path to your home.  Use bright outdoor lighting.  Clear any walking paths of anything that might make someone trip, such as rocks or tools.  Regularly check to see if handrails are loose or broken. Make sure that both sides of any steps have handrails.  Any raised decks and porches should have guardrails on the edges.  Have any leaves, snow, or ice cleared regularly.  Use sand or salt on walking paths during winter.  Clean up any spills in your garage right away. This includes oil or grease spills. What can I do in the bathroom?  Use night lights.  Install grab bars by the toilet and in the tub and shower. Do not use towel  bars as grab bars.  Use non-skid mats or decals in the tub or shower.  If you need to sit down in the shower, use a plastic, non-slip stool.  Keep the floor dry. Clean up any water that spills on the floor as soon as it happens.  Remove soap buildup in the tub or shower regularly.  Attach bath mats securely with double-sided non-slip rug tape.  Do not have throw rugs and other things on the floor that can make you trip. What can I do in the bedroom?  Use night lights.  Make sure that you have a light by your bed that is easy to reach.  Do not use any sheets or blankets that are too big for your bed. They should not hang down onto the floor.  Have a firm chair that has side arms. You can use this for support while you get dressed.  Do not have throw rugs and other things on the floor that can make you trip. What can I do in the kitchen?  Clean up any  spills right away.  Avoid walking on wet floors.  Keep items that you use a lot in easy-to-reach places.  If you need to reach something above you, use a strong step stool that has a grab bar.  Keep electrical cords out of the way.  Do not use floor polish or wax that makes floors slippery. If you must use wax, use non-skid floor wax.  Do not have throw rugs and other things on the floor that can make you trip. What can I do with my stairs?  Do not leave any items on the stairs.  Make sure that there are handrails on both sides of the stairs and use them. Fix handrails that are broken or loose. Make sure that handrails are as long as the stairways.  Check any carpeting to make sure that it is firmly attached to the stairs. Fix any carpet that is loose or worn.  Avoid having throw rugs at the top or bottom of the stairs. If you do have throw rugs, attach them to the floor with carpet tape.  Make sure that you have a light switch at the top of the stairs and the bottom of the stairs. If you do not have them, ask someone to  add them for you. What else can I do to help prevent falls?  Wear shoes that:  Do not have high heels.  Have rubber bottoms.  Are comfortable and fit you well.  Are closed at the toe. Do not wear sandals.  If you use a stepladder:  Make sure that it is fully opened. Do not climb a closed stepladder.  Make sure that both sides of the stepladder are locked into place.  Ask someone to hold it for you, if possible.  Clearly mark and make sure that you can see:  Any grab bars or handrails.  First and last steps.  Where the edge of each step is.  Use tools that help you move around (mobility aids) if they are needed. These include:  Canes.  Walkers.  Scooters.  Crutches.  Turn on the lights when you go into a dark area. Replace any light bulbs as soon as they burn out.  Set up your furniture so you have a clear path. Avoid moving your furniture around.  If any of your floors are uneven, fix them.  If there are any pets around you, be aware of where they are.  Review your medicines with your doctor. Some medicines can make you feel dizzy. This can increase your chance of falling. Ask your doctor what other things that you can do to help prevent falls. This information is not intended to replace advice given to you by your health care provider. Make sure you discuss any questions you have with your health care provider. Document Released: 08/15/2009 Document Revised: 03/26/2016 Document Reviewed: 11/23/2014 Elsevier Interactive Patient Education  2017 [employer].

## 2020-12-24 NOTE — Progress Notes (Signed)
Subjective:   Mayo Owczarzak is a 75 y.o. female who presents for an Initial Medicare Annual Wellness Visit.  Review of Systems    No ROS.  Medicare Wellness Virtual Visit.     Cardiac Risk Factors include: advanced age (>70men, >62 women);hypertension     Objective:    Today's Vitals   12/24/20 1537  Weight: 120 lb (54.4 kg)  Height: $Remove'5\' 2"'tFdHQMU$  (1.575 m)   Body mass index is 21.95 kg/m.  Advanced Directives 12/24/2020 08/23/2020 04/12/2020 08/18/2019 08/18/2019  Does Patient Have a Medical Advance Directive? No No No No No  Would patient like information on creating a medical advance directive? No - Patient declined - - No - Patient declined -    Current Medications (verified) Outpatient Encounter Medications as of 12/24/2020  Medication Sig  . amLODipine (NORVASC) 2.5 MG tablet Take 1 tablet (2.5 mg total) by mouth daily. If BP>130/>80  . amoxicillin-clavulanate (AUGMENTIN) 875-125 MG tablet Take 1 tablet by mouth 2 (two) times daily.  . Ascorbic Acid (VITAMIN C PO) Take by mouth daily.  Marland Kitchen aspirin EC 81 MG tablet Take 81 mg by mouth daily.  . B Complex Vitamins (VITAMIN B COMPLEX PO) Take by mouth.  . Cholecalciferol (VITAMIN D3) 50 MCG (2000 UT) capsule Take 2,000 Units by mouth daily.  . clindamycin (CLEOCIN) 300 MG capsule Take 1 capsule (300 mg total) by mouth in the morning and at bedtime. X 7-10 days  . imatinib (GLEEVEC) 400 MG tablet Take 1 tablet (400 mg total) by mouth daily. Take with meals and large glass of water.Caution:Chemotherapy.  Marland Kitchen lisinopril (ZESTRIL) 40 MG tablet Take 1 tablet (40 mg total) by mouth daily. D/c lis 20-hctz 25  . metoprolol tartrate (LOPRESSOR) 50 MG tablet Take 1 tablet (50 mg total) by mouth 2 (two) times daily.  . multivitamin-lutein (OCUVITE-LUTEIN) CAPS capsule Take 1 capsule by mouth daily.  . Olopatadine HCl 0.2 % SOLN Apply 1 drop to eye daily.  . Omega-3 Fatty Acids (OMEGA 3 PO) Take by mouth daily.  . predniSONE (DELTASONE) 20 MG  tablet Take 1 tablet (20 mg total) by mouth daily with breakfast.  . simvastatin (ZOCOR) 20 MG tablet Take 1 tablet (20 mg total) by mouth daily at 6 PM.   No facility-administered encounter medications on file as of 12/24/2020.    Allergies (verified) Acetaminophen, Hctz [hydrochlorothiazide], and Tylenol with codeine #3  [acetaminophen-codeine]   History: Past Medical History:  Diagnosis Date  . Arthritis    left shoulder, right low back and right hip   . Hyperlipidemia   . Hypertension   . UTI (urinary tract infection)    Past Surgical History:  Procedure Laterality Date  . CATARACT EXTRACTION     b/l   . 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 Breast cancer Other    Social History   Socioeconomic History  . Marital status: Divorced    Spouse name: Not on file  . Number of children: Not on file  . Years of education: Not on file  . Highest education level: Not on file  Occupational History  . Not on file  Tobacco Use  . Smoking status: Never Smoker  . Smokeless tobacco: Never Used  Substance and Sexual Activity  . Alcohol use: Not Currently  . Drug use: Not Currently  . Sexual activity: Not Currently  Other  Topics Concern  . Not on file  Social History Narrative   4 daughters    [place] here from [place], [place].    Currently not working and pending insurance as of 06/30/19    From the [employer]    Social Determinants of Health   Financial Resource Strain: Low Risk   . Difficulty of Paying Living Expenses: Not hard at all  Food Insecurity: No Food Insecurity  . Worried About Charity fundraiser in the Last Year: Never true  . Ran Out of Food in the Last Year: Never true  Transportation Needs: No Transportation Needs  . Lack of Transportation (Medical): No  . Lack of Transportation (Non-Medical): No  Physical Activity: Sufficiently Active  . Days of  Exercise per Week: 7 days  . Minutes of Exercise per Session: 30 min  Stress: No Stress Concern Present  . Feeling of Stress : Only a little  Social Connections: Moderately Integrated  . Frequency of Communication with Friends and Family: Not on file  . Frequency of Social Gatherings with Friends and Family: More than three times a week  . Attends Religious Services: 1 to 4 times per year  . Active Member of Clubs or Organizations: Yes  . Attends Archivist Meetings: 1 to 4 times per year  . Marital Status: Divorced    Tobacco Counseling Counseling given: Not Answered   Clinical Intake:  Pre-visit preparation completed: Yes        Diabetes: No  How often do you need to have someone help you when you read instructions, pamphlets, or other written materials from your doctor or pharmacy?: 1 - Never   Interpreter Needed?: No      Activities of Daily Living In your present state of health, do you have any difficulty performing the following activities: 12/24/2020  Hearing? N  Vision? N  Difficulty concentrating or making decisions? N  Walking or climbing stairs? Y  Comment Unsteady gait. Paces self.  Dressing or bathing? N  Doing errands, shopping? Y  Preparing Food and eating ? N  Using the Toilet? N  In the past six months, have you accidently leaked urine? N  Do you have problems with loss of bowel control? N  Managing your Medications? N  Managing your Finances? Y  Housekeeping or managing your Housekeeping? N  Some recent data might be hidden    Patient Care Team: McLean-Scocuzza, Nino Glow, MD as PCP - General (Internal Medicine)  Indicate any recent Medical Services you may have received from other than [facility] providers in the past year (date may be approximate).     Assessment:   This is a routine wellness examination for Virgil.  I connected with [place] today by telephone and verified that I am speaking with the correct person using two  identifiers. Location patient: home Location provider: work Persons participating in the virtual visit: patient, daughter, Marine scientist.    I discussed the limitations, risks, security and privacy concerns of performing an evaluation and management service by telephone and the availability of in person appointments. The patient expressed understanding and verbally consented to this telephonic visit.    Interactive audio and video telecommunications were attempted between this provider and patient, however failed, due to patient having technical difficulties OR patient did not have access to video capability.  We continued and completed visit with audio only.  Some vital signs may be absent or patient reported.   Hearing/Vision screen  Hearing Screening   '125Hz'$  $Remo'250Hz'NyhrU$   $'500Hz'C$'1000Hz'$'2000Hz'$'3000Hz'$'4000Hz'$'6000Hz'$'8000Hz'$   Right ear:           Left ear:           Comments: Patient is able to hear conversational tones without difficulty.  No issues reported.  Vision Screening Comments: Wears corrective lenses Visual acuity not assessed, virtual visit.  They have seen their ophthalmologist in the last 12 months.    Dietary issues and exercise activities discussed: Current Exercise Habits: Home exercise routine, Type of exercise: stretching, Time (Minutes): 30, Frequency (Times/Week): 7, Weekly Exercise (Minutes/Week): 210, Intensity: Mild  Healthy diet  Good water intake  Goals    . Wear glasses daily      Depression Screen PHQ 2/9 Scores 12/24/2020 03/12/2020  PHQ - 2 Score 1 0    Fall Risk Fall Risk  12/24/2020 11/15/2020 09/13/2020 03/12/2020 10/13/2019  Falls in the past year? 0 0 0 0 0  Number falls in past yr: 0 0 0 0 -  Injury with Fall? 0 0 0 0 -  Follow up Falls evaluation completed Falls evaluation completed Falls evaluation completed Falls evaluation completed -    FALL RISK PREVENTION PERTAINING TO THE HOME: Handrails in use when climbing stairs? Yes Home free of loose throw rugs  in walkways, pet beds, electrical cords, etc? Yes  Adequate lighting in your home to reduce risk of falls? Yes   ASSISTIVE DEVICES UTILIZED TO PREVENT FALLS: Life alert? No  Use of a cane, walker or w/c? No  Grab bars in the bathroom? Yes  Elevated toilet seat or a handicapped toilet? Yes   TIMED UP AND GO: Was the test performed? No . Virtual visit.   Cognitive Function:  Patient is alert and oriented.    6CIT Screen 12/24/2020  What Year? 0 points  What month? 0 points  What time? 0 points    Immunizations Immunization History  Administered Date(s) Administered  . Fluad Quad(high Dose 65+) 07/13/2019, 09/13/2020  . Influenza-Unspecified 09/02/2019  . PFIZER(Purple Top)SARS-COV-2 Vaccination 03/13/2020, 04/03/2020, 11/01/2020    TDAP status: Due, Education has been provided regarding the importance of this vaccine. Advised may receive this vaccine at local pharmacy or Health Dept. Aware to provide a copy of the vaccination record if obtained from local pharmacy or Health Dept. Verbalized acceptance and understanding. Deferred.   Health Maintenance Health Maintenance  Topic Date Due  . TETANUS/TDAP  Never done  . DEXA SCAN  Never done  . PNA vac Low Risk Adult (1 of 2 - PCV13) Never done  . COVID-19 Vaccine (4 - Booster for Pfizer series) 05/01/2021  . INFLUENZA VACCINE  Completed  . Hepatitis C Screening  Completed   Colonoscopy- 11/30/19.  Mammogram status: Completed 09/17/20. Repeat every year  Bone density- ordered 09/17/20. Plans to schedule.   Lung Cancer Screening: (Low Dose CT Chest recommended if Age 36-80 years, 30 pack-year currently smoking OR have quit w/in 15years.) does not qualify.   Vision Screening: Recommended annual ophthalmology exams for early detection of glaucoma and other disorders of the eye. Is the patient up to date with their annual eye exam?  Yes   Dental Screening: Recommended annual dental exams for proper oral hygiene.  Community  Resource Referral / Chronic Care Management: CRR required this visit?  Yes ; referral sent for transportation and life alert.  CCM required this visit?  No      Plan:   Keep all routine maintenance appointments.   Follow up 03/13/21 @  9:00  Referral sent for transportation to medical appointments and assistance with life alert.   I have personally reviewed and noted the following in the patient's chart:   . Medical and social history . Use of alcohol, tobacco or illicit drugs  . Current medications and supplements . Functional ability and status . Nutritional status . Physical activity . Advanced directives . List of other physicians . Hospitalizations, surgeries, and ER visits in previous 12 months . Vitals . Screenings to include cognitive, depression, and falls . Referrals and appointments  In addition, I have reviewed and discussed with patient certain preventive protocols, quality metrics, and best practice recommendations. A written personalized care plan for preventive services as well as general preventive health recommendations were provided to patient via mychart.     Varney Biles, LPN   02/26/622

## 2020-12-31 ENCOUNTER — Telehealth: Payer: Self-pay | Admitting: Internal Medicine

## 2020-12-31 NOTE — Telephone Encounter (Signed)
   Telephone encounter was:  Successful.  12/31/2020 Name: Chelsey Lynch MRN: [id number] DOB: 1946/09/18  Chelsey Lynch is a 75 y.o. year old female who is a primary care patient of McLean-Scocuzza, Nino Glow, MD . The community resource team was consulted for assistance with Transportation Needs  and [place] guide performed the following interventions: Discussed resources to assist with transportation and [employer]. Informed [name] that Care Guide will complete a referral for [place] and also send her an e-mail with a brochure for [facility]. Verified e-mail on file. [name] stated she will be looking out for the email and a call from [facility]. .  Follow Up Plan:  Care guide will follow up with patient by phone over the next week and complete transportation referral for patient. Sent e-mail to patient's daughter [name] for [place].   [place], Care Management Phone: [phone] Email: [email or web address]

## 2021-01-01 ENCOUNTER — Encounter: Payer: Self-pay | Admitting: Internal Medicine

## 2021-01-06 ENCOUNTER — Telehealth: Payer: Self-pay | Admitting: Internal Medicine

## 2021-01-06 NOTE — Telephone Encounter (Signed)
   Telephone encounter was:  Successful.  01/06/2021 Name: Chelsey Lynch MRN: [id number] DOB: December 29, 1945  Chelsey Lynch is a 75 y.o. year old female who is a primary care patient of McLean-Scocuzza, Nino Glow, MD . The community resource team was consulted for assistance with Transportation Needs   Care guide performed the following interventions: Spoke with patient's daughter Chelsey Lynch regarding transportation assistance. Chelsey Lynch stated that she has not heard from [employer] yet. Care Guide called [facility] to get status on referral. Representative stated that someone is working on the referral and will give Chelsey Lynch a call to verify and confirm the ride and they will also send Care Guide an e-mail. Chelsey Lynch the information. Chelsey Lynch stated understanding..  Follow Up Plan:  Care guide will follow up with patient by phone over the next week  [place], Care Management Phone: [phone] Email: [email or web address]

## 2021-01-09 ENCOUNTER — Telehealth: Payer: Self-pay | Admitting: Internal Medicine

## 2021-01-09 NOTE — Telephone Encounter (Signed)
Referral Reason: Transportation Needs   Interventions: Spoke with Darrick Meigs, [facility]. He stated that he has spoken with the patient's daughter and was able to verity information. Patient's appointment has been rescheduled for February 07, 2021 and ride has been scheduled.   Follow up plan: No further follow up planned at this time. The patient has been provided with needed resources.   Chelsey Lynch, Care Management Phone: [phone] Email: [email or web address]

## 2021-01-09 NOTE — Telephone Encounter (Signed)
   Chelsey Lynch DOB: 1945-12-26 MRN: [id number]   RIDER WAIVER AND RELEASE OF LIABILITY  For purposes of improving physical access to our facilities, [facility] is pleased to partner with third parties to provide [place] patients or other authorized individuals the option of convenient, on-demand ground transportation services (the [place]") through use of the technology service that enables users to request on-demand ground transportation from independent third-party providers.  By opting to use and accept these [employer], I, the undersigned, hereby agree on behalf of myself, and on behalf of any minor child using the [employer] for whom I am the parent or legal guardian, as follows:  1. Government social research officer provided to me are provided by independent third-party transportation providers who are not [employer] or employees and who are unaffiliated with [employer]. 2. [facility] is neither a transportation carrier nor a common or public carrier. 3. [facility] has no control over the quality or safety of the transportation that occurs as a result of the [employer]. 4. [facility] cannot guarantee that any third-party transportation provider will complete any arranged transportation service. 5. [facility] makes no representation, warranty, or guarantee regarding the reliability, timeliness, quality, safety, suitability, or availability of any of the Transport Services or that they will be error free. 6. I fully understand that traveling by vehicle involves risks and dangers of serious bodily injury, including permanent disability, paralysis, and death. I agree, on behalf of myself and on behalf of any minor child using the Transport Services for whom I am the parent or legal guardian, that the entire risk arising out of my use of the [employer] remains solely with me, to the maximum extent permitted under applicable law. 7. The [employer]  are provided "as is" and "as available." [place] disclaims all representations and warranties, express, implied or statutory, not expressly set out in these terms, including the implied warranties of merchantability and fitness for a particular purpose. 8. I hereby waive and release [facility], its agents, employees, officers, directors, representatives, insurers, attorneys, assigns, successors, subsidiaries, and affiliates from any and all past, present, or future claims, demands, liabilities, actions, causes of action, or suits of any kind directly or indirectly arising from acceptance and use of the [employer]. 9. I further waive and release [facility] and its affiliates from all present and future liability and responsibility for any injury or death to persons or damages to property caused by or related to the use of the [employer]. 10. I have read this Waiver and Release of Liability, and I understand the terms used in it and their legal significance. This Waiver is freely and voluntarily given with the understanding that my right (as well as the right of any minor child for whom I am the parent or legal guardian using the [employer]) to legal recourse against  in connection with the [employer] is knowingly surrendered in return for use of these services.   I attest that I read the consent document to Chelsey Lynch, gave Ms. Vanyo the opportunity to ask questions and answered the questions asked (if any). I affirm that Chelsey Lynch then provided consent for she's participation in this program.     Chelsey Lynch

## 2021-01-10 ENCOUNTER — Ambulatory Visit: Payer: Medicare Other | Admitting: Internal Medicine

## 2021-02-05 ENCOUNTER — Other Ambulatory Visit: Payer: Self-pay | Admitting: Internal Medicine

## 2021-02-05 DIAGNOSIS — E785 Hyperlipidemia, unspecified: Secondary | ICD-10-CM

## 2021-02-07 ENCOUNTER — Other Ambulatory Visit: Payer: Self-pay

## 2021-02-07 ENCOUNTER — Telehealth: Payer: Self-pay | Admitting: Internal Medicine

## 2021-02-07 ENCOUNTER — Ambulatory Visit (INDEPENDENT_AMBULATORY_CARE_PROVIDER_SITE_OTHER): Payer: Medicare Other | Admitting: Internal Medicine

## 2021-02-07 VITALS — BP 150/82 | HR 56 | Temp 97.4°F | Ht 62.0 in | Wt 122.0 lb

## 2021-02-07 DIAGNOSIS — Z1329 Encounter for screening for other suspected endocrine disorder: Secondary | ICD-10-CM

## 2021-02-07 DIAGNOSIS — R7303 Prediabetes: Secondary | ICD-10-CM

## 2021-02-07 DIAGNOSIS — I8393 Asymptomatic varicose veins of bilateral lower extremities: Secondary | ICD-10-CM

## 2021-02-07 DIAGNOSIS — E785 Hyperlipidemia, unspecified: Secondary | ICD-10-CM

## 2021-02-07 DIAGNOSIS — I10 Essential (primary) hypertension: Secondary | ICD-10-CM

## 2021-02-07 DIAGNOSIS — M545 Low back pain, unspecified: Secondary | ICD-10-CM

## 2021-02-07 DIAGNOSIS — R748 Abnormal levels of other serum enzymes: Secondary | ICD-10-CM

## 2021-02-07 DIAGNOSIS — Z0100 Encounter for examination of eyes and vision without abnormal findings: Secondary | ICD-10-CM

## 2021-02-07 DIAGNOSIS — G5603 Carpal tunnel syndrome, bilateral upper limbs: Secondary | ICD-10-CM

## 2021-02-07 DIAGNOSIS — H43399 Other vitreous opacities, unspecified eye: Secondary | ICD-10-CM

## 2021-02-07 DIAGNOSIS — G8929 Other chronic pain: Secondary | ICD-10-CM

## 2021-02-07 DIAGNOSIS — E559 Vitamin D deficiency, unspecified: Secondary | ICD-10-CM

## 2021-02-07 DIAGNOSIS — Z5982 Transportation insecurity: Secondary | ICD-10-CM

## 2021-02-07 DIAGNOSIS — N3000 Acute cystitis without hematuria: Secondary | ICD-10-CM

## 2021-02-07 DIAGNOSIS — F5104 Psychophysiologic insomnia: Secondary | ICD-10-CM

## 2021-02-07 DIAGNOSIS — R519 Headache, unspecified: Secondary | ICD-10-CM | POA: Diagnosis not present

## 2021-02-07 DIAGNOSIS — C49A0 Gastrointestinal stromal tumor, unspecified site: Secondary | ICD-10-CM

## 2021-02-07 DIAGNOSIS — Z9189 Other specified personal risk factors, not elsewhere classified: Secondary | ICD-10-CM

## 2021-02-07 LAB — CBC WITH DIFFERENTIAL/PLATELET
Basophils Absolute: 0 10*3/uL (ref 0.0–0.1)
Basophils Relative: 0.8 % (ref 0.0–3.0)
Eosinophils Absolute: 0.2 10*3/uL (ref 0.0–0.7)
Eosinophils Relative: 3.7 % (ref 0.0–5.0)
HCT: 38.7 % (ref 36.0–46.0)
Hemoglobin: 12.9 g/dL (ref 12.0–15.0)
Lymphocytes Relative: 27.5 % (ref 12.0–46.0)
Lymphs Abs: 1.2 10*3/uL (ref 0.7–4.0)
MCHC: 33.3 g/dL (ref 30.0–36.0)
MCV: 91.5 fl (ref 78.0–100.0)
Monocytes Absolute: 0.3 10*3/uL (ref 0.1–1.0)
Monocytes Relative: 7.8 % (ref 3.0–12.0)
Neutro Abs: 2.6 10*3/uL (ref 1.4–7.7)
Neutrophils Relative %: 60.2 % (ref 43.0–77.0)
Platelets: 206 10*3/uL (ref 150.0–400.0)
RBC: 4.23 Mil/uL (ref 3.87–5.11)
RDW: 13.9 % (ref 11.5–15.5)
WBC: 4.4 10*3/uL (ref 4.0–10.5)

## 2021-02-07 LAB — TSH: TSH: 1.2 u[IU]/mL (ref 0.35–4.50)

## 2021-02-07 LAB — COMPREHENSIVE METABOLIC PANEL
ALT: 45 U/L — ABNORMAL HIGH (ref 0–35)
AST: 31 U/L (ref 0–37)
Albumin: 4.4 g/dL (ref 3.5–5.2)
Alkaline Phosphatase: 63 U/L (ref 39–117)
BUN: 15 mg/dL (ref 6–23)
CO2: 31 mEq/L (ref 19–32)
Calcium: 9.5 mg/dL (ref 8.4–10.5)
Chloride: 103 mEq/L (ref 96–112)
Creatinine, Ser: 0.81 mg/dL (ref 0.40–1.20)
GFR: 70.54 mL/min (ref >60.00)
Glucose, Bld: 82 mg/dL (ref 70–99)
Potassium: 4.4 mEq/L (ref 3.5–5.1)
Sodium: 139 mEq/L (ref 135–145)
Total Bilirubin: 0.6 mg/dL (ref 0.2–1.2)
Total Protein: 7.3 g/dL (ref 6.0–8.3)

## 2021-02-07 LAB — LIPID PANEL
Cholesterol: 158 mg/dL (ref 0–200)
HDL: 68.2 mg/dL (ref >39.00)
LDL Cholesterol: 74 mg/dL (ref 0–99)
NonHDL: 89.91
Total CHOL/HDL Ratio: 2
Triglycerides: 81 mg/dL (ref 0.0–149.0)
VLDL: 16.2 mg/dL (ref 0.0–40.0)

## 2021-02-07 LAB — HEMOGLOBIN A1C: Hgb A1c MFr Bld: 5.9 % (ref 4.6–6.5)

## 2021-02-07 LAB — URINALYSIS, ROUTINE W REFLEX MICROSCOPIC
Bilirubin Urine: NEGATIVE
Hgb urine dipstick: NEGATIVE
Ketones, ur: NEGATIVE
Nitrite: NEGATIVE
Specific Gravity, Urine: 1.01 (ref 1.000–1.030)
Total Protein, Urine: NEGATIVE
Urine Glucose: NEGATIVE
Urobilinogen, UA: 0.2 (ref 0.0–1.0)
pH: 7 (ref 5.0–8.0)

## 2021-02-07 LAB — VITAMIN D 25 HYDROXY (VIT D DEFICIENCY, FRACTURES): VITD: 59.49 ng/mL (ref 30.00–100.00)

## 2021-02-07 MED ORDER — LISINOPRIL 40 MG PO TABS: 40.0000 mg | ORAL_TABLET | Freq: Every day | ORAL | 3 refills | Status: DC

## 2021-02-07 MED ORDER — ESZOPICLONE 1 MG PO TABS: 0.5000 mg | ORAL_TABLET | Freq: Every evening | ORAL | 0 refills | Status: DC | PRN

## 2021-02-07 MED ORDER — SIMVASTATIN 20 MG PO TABS: ORAL_TABLET | ORAL | 3 refills | Status: DC

## 2021-02-07 MED ORDER — METOPROLOL TARTRATE 50 MG PO TABS: 50.0000 mg | ORAL_TABLET | Freq: Two times a day (BID) | ORAL | 3 refills | Status: DC

## 2021-02-07 MED ORDER — AMLODIPINE BESYLATE 2.5 MG PO TABS: 2.5000 mg | ORAL_TABLET | Freq: Every day | ORAL | 3 refills | Status: DC

## 2021-02-07 NOTE — Telephone Encounter (Signed)
vm is full not able to leave vm.  Sent msg via [employer]

## 2021-02-07 NOTE — Progress Notes (Signed)
Chief Complaint  Patient presents with  . Follow-up   F/u with translator via video speaking [employer] 1. C/o sinus issues facial pain f/u with ENT 1 month ago and pending blood test for allergies but had sinus pain since dental surgery 4 months ago 7/10 at times and h/a  2. H/a pressure like pain 7/10 3x per week at times to 3x per month and #1  3. C/o trouble sleeping only 3-4 hours at a time waking up wants to try Rx medication she does not take naps in the daytime 4. C/o b/l hand numbness and pain and swelling at times  5. GIST tumor and elevated lfts sent referral GI locally pt declines to go back to [facility] after surgery    Review of Systems  Constitutional: Negative for weight loss.  HENT: Negative for hearing loss.   Eyes: Negative for blurred vision.       +floaters  Respiratory: Negative for shortness of breath.   Cardiovascular: Negative for chest pain.  Gastrointestinal: Negative for abdominal pain.  Musculoskeletal: Positive for back pain.  Skin: Negative for rash.  Neurological: Positive for sensory change and headaches.  Psychiatric/Behavioral: Negative for depression.   Past Medical History:  Diagnosis Date  . Arthritis    left shoulder, right low back and right hip   . Hyperlipidemia   . Hypertension   . UTI (urinary tract infection)    Past Surgical History:  Procedure Laterality Date  . CATARACT EXTRACTION     b/l   . 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 Breast cancer Other    Social History   Socioeconomic History  . Marital status: Divorced    Spouse name: Not on file  . Number of children: Not on file  . Years of education: Not on file  . Highest education level: Not on file  Occupational History  . Not on file  Tobacco Use  . Smoking status: Never Smoker  . Smokeless tobacco: Never Used  Substance and Sexual Activity  .  Alcohol use: Not Currently  . Drug use: Not Currently  . Sexual activity: Not Currently  Other Topics Concern  . Not on file  Social History Narrative   4 daughters    [place] here from [place], [place].    Currently not working and pending insurance as of 06/30/19    From the [employer]    Social Determinants of Health   Financial Resource Strain: Low Risk   . Difficulty of Paying Living Expenses: Not hard at all  Food Insecurity: No Food Insecurity  . Worried About Charity fundraiser in the Last Year: Never true  . Ran Out of Food in the Last Year: Never true  Transportation Needs: No Transportation Needs  . Lack of Transportation (Medical): No  . Lack of Transportation (Non-Medical): No  Physical Activity: Sufficiently Active  . Days of Exercise per Week: 7 days  . Minutes of Exercise per Session: 30 min  Stress: No Stress Concern Present  . Feeling of Stress : Only a little  Social Connections: Moderately Integrated  . Frequency of Communication with Friends and Family: Not on file  . Frequency of Social Gatherings with Friends and Family: More than three times a week  . Attends Religious Services: 1 to 4 times per year  . Active Member of Clubs or Organizations: Yes  .  Attends Archivist Meetings: 1 to 4 times per year  . Marital Status: Divorced  Human resources officer Violence: Not At Risk  . Fear of Current or Ex-Partner: No  . Emotionally Abused: No  . Physically Abused: No  . Sexually Abused: No   Current Meds  Medication Sig  . eszopiclone (LUNESTA) 1 MG TABS tablet Take 0.5-1 tablets (0.5-1 mg total) by mouth at bedtime as needed for sleep. Take immediately before bedtime   Allergies  Allergen Reactions  . Acetaminophen Other (See Comments)    Cold sweats, general malaise  . Hctz [Hydrochlorothiazide]     Hyponatremia   . Tylenol With Codeine #3  [Acetaminophen-Codeine]     Other reaction(s): Unknown   Recent Results (from the past 2160 hour(s))   Comprehensive metabolic panel     Status: Abnormal   Collection Time: 02/07/21 12:49 PM  Result Value Ref Range   Sodium 139 135 - 145 mEq/L   Potassium 4.4 3.5 - 5.1 mEq/L   Chloride 103 96 - 112 mEq/L   CO2 31 19 - 32 mEq/L   Glucose, Bld 82 70 - 99 mg/dL   BUN 15 6 - 23 mg/dL   Creatinine, Ser 0.81 0.40 - 1.20 mg/dL   Total Bilirubin 0.6 0.2 - 1.2 mg/dL   Alkaline Phosphatase 63 39 - 117 U/L   AST 31 0 - 37 U/L   ALT 45 (H) 0 - 35 U/L   Total Protein 7.3 6.0 - 8.3 g/dL   Albumin 4.4 3.5 - 5.2 g/dL   GFR 70.54 >60.00 mL/min    Comment: Calculated using the CKD-EPI Creatinine Equation (2021)   Calcium 9.5 8.4 - 10.5 mg/dL  Lipid panel     Status: None   Collection Time: 02/07/21 12:49 PM  Result Value Ref Range   Cholesterol 158 0 - 200 mg/dL    Comment: ATP III Classification       Desirable:  < 200 mg/dL               Borderline High:  200 - 239 mg/dL          High:  > = 240 mg/dL   Triglycerides 81.0 0.0 - 149.0 mg/dL    Comment: Normal:  <150 mg/dLBorderline High:  150 - 199 mg/dL   HDL 68.20 >39.00 mg/dL   VLDL 16.2 0.0 - 40.0 mg/dL   LDL Cholesterol 74 0 - 99 mg/dL   Total CHOL/HDL Ratio 2     Comment:                Men          Women1/2 Average Risk     3.4          3.3Average Risk          5.0          4.42X Average Risk          9.6          7.13X Average Risk          15.0          11.0                       NonHDL 89.91     Comment: NOTE:  Non-HDL goal should be 30 mg/dL higher than patient's LDL goal (i.e. LDL goal of < 70 mg/dL, would have non-HDL goal of < 100 mg/dL)  CBC with Differential/Platelet  Status: None   Collection Time: 02/07/21 12:49 PM  Result Value Ref Range   WBC 4.4 4.0 - 10.5 K/uL   RBC 4.23 3.87 - 5.11 Mil/uL   Hemoglobin 12.9 12.0 - 15.0 g/dL   HCT 38.7 36.0 - 46.0 %   MCV 91.5 78.0 - 100.0 fl   MCHC 33.3 30.0 - 36.0 g/dL   RDW 13.9 11.5 - 15.5 %   Platelets 206.0 150.0 - 400.0 K/uL   Neutrophils Relative % 60.2 43.0 - 77.0 %    Lymphocytes Relative 27.5 12.0 - 46.0 %   Monocytes Relative 7.8 3.0 - 12.0 %   Eosinophils Relative 3.7 0.0 - 5.0 %   Basophils Relative 0.8 0.0 - 3.0 %   Neutro Abs 2.6 1.4 - 7.7 K/uL   Lymphs Abs 1.2 0.7 - 4.0 K/uL   Monocytes Absolute 0.3 0.1 - 1.0 K/uL   Eosinophils Absolute 0.2 0.0 - 0.7 K/uL   Basophils Absolute 0.0 0.0 - 0.1 K/uL  TSH     Status: None   Collection Time: 02/07/21 12:49 PM  Result Value Ref Range   TSH 1.20 0.35 - 4.50 uIU/mL  Urinalysis, Routine w reflex microscopic     Status: Abnormal   Collection Time: 02/07/21 12:49 PM  Result Value Ref Range   Color, Urine YELLOW Yellow;Lt. Yellow;Straw;Dark Yellow;Amber;Green;Red;Brown   APPearance CLEAR Clear;Turbid;Slightly Cloudy;Cloudy   Specific Gravity, Urine 1.010 1.000 - 1.030   pH 7.0 5.0 - 8.0   Total Protein, Urine NEGATIVE Negative   Urine Glucose NEGATIVE Negative   Ketones, ur NEGATIVE Negative   Bilirubin Urine NEGATIVE Negative   Hgb urine dipstick NEGATIVE Negative   Urobilinogen, UA 0.2 0.0 - 1.0   Leukocytes,Ua SMALL (A) Negative   Nitrite NEGATIVE Negative   WBC, UA 3-6/hpf (A) 0-2/hpf   RBC / HPF 0-2/hpf 0-2/hpf   Squamous Epithelial / LPF Rare(0-4/hpf) Rare(0-4/hpf)  Hemoglobin A1c     Status: None   Collection Time: 02/07/21 12:49 PM  Result Value Ref Range   Hgb A1c MFr Bld 5.9 4.6 - 6.5 %    Comment: Glycemic Control Guidelines for People with Diabetes:Non Diabetic:  <6%Goal of Therapy: <7%Additional Action Suggested:  >8%   Vitamin D (25 hydroxy)     Status: None   Collection Time: 02/07/21 12:56 PM  Result Value Ref Range   VITD 59.49 30.00 - 100.00 ng/mL   Objective  Body mass index is 22.31 kg/m. Wt Readings from Last 3 Encounters:  02/07/21 122 lb (55.3 kg)  12/24/20 120 lb (54.4 kg)  11/15/20 120 lb (54.4 kg)   Temp Readings from Last 3 Encounters:  02/07/21 (!) 97.4 F (36.3 C) (Oral)  09/13/20 98.1 F (36.7 C) (Oral)  08/23/20 (!) 97.3 F (36.3 C)   BP Readings  from Last 3 Encounters:  02/07/21 (!) 150/82  09/13/20 124/70  08/23/20 114/68   Pulse Readings from Last 3 Encounters:  02/07/21 (!) 56  09/13/20 (!) 56  08/23/20 (!) 56    Physical Exam Vitals and nursing note reviewed.  Constitutional:      Appearance: Normal appearance. She is well-developed and well-groomed.  HENT:     Head: Normocephalic and atraumatic.  Cardiovascular:     Rate and Rhythm: Regular rhythm. Bradycardia present.     Heart sounds: Normal heart sounds. No murmur heard.   Pulmonary:     Effort: Pulmonary effort is normal.     Breath sounds: Normal breath sounds.  Skin:    General:  Skin is warm and dry.  Neurological:     General: No focal deficit present.     Mental Status: She is alert and oriented to person, place, and time. Mental status is at baseline.     Gait: Gait normal.  Psychiatric:        Attention and Perception: Attention and perception normal.        Mood and Affect: Mood and affect normal.        Speech: Speech normal.        Behavior: Behavior normal. Behavior is cooperative.        Thought Content: Thought content normal.        Cognition and Memory: Cognition and memory normal.        Judgment: Judgment normal.     Assessment  Plan  Chronic intractable headache, unspecified headache type - Plan: CT Head Wo Contrast  Essential hypertension - Plan: Comprehensive metabolic panel, Lipid panel, CBC with Differential/Platelet, amLODipine (NORVASC) 2.5 MG tablet, lisinopril (ZESTRIL) 40 MG tablet, metoprolol tartrate (LOPRESSOR) 50 MG tablet bid  Hyperlipidemia, unspecified hyperlipidemia type - Plan: simvastatin (ZOCOR) 20 MG tablet  Prediabetes - Plan: Hemoglobin A1c  Chronic insomnia waking up Q3-4 hrs - Plan: eszopiclone (LUNESTA) 1 MG TABS tablet  Vitamin D deficiency - Plan: Vitamin D (25 hydroxy) On otc D3 5000 IU qd   Chronic low back pain, unspecified back pain laterality, unspecified whether sciatica present Given Rx  back brace  Lidocaine pain patch or Salonpas pain patch over the counter You can try a back brace  F/u PM&R   Malignant gastrointestinal stromal tumor, unspecified site [facility]) - Plan: Ambulatory referral to Gastroenterology  Elevated liver enzymes - Plan: Ambulatory referral to Gastroenterology  Declines to see [facility] in future  Bilateral carpal tunnel syndrome  For now declines hand ortho  Rx wrist brace/splint b/l mail to pt can get at medical supply store   Varicose veins b/l legs  rec compression stockings coppertone otc   Hm  Flu shot utd covid 2/2 pfizer consider booster rec Tdap, shingrix, prevnar if not had  She does report she had pna 23 vaccine 6/28/20but no record  mammo referredwill ask daughter [name] to schedule 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 Range 02/07/2021 12:49  Hemoglobin A1C Latest Ref Range: 4.6 - 6.5 % 5.9   Former PCP Dr. Marguerita Beards in [place] try to get records  Specialists  GIST tumor 2/16/21KIT exon 13 detected + [facility] Dr. Mont Dutton  H/o Dr. Dorathy Daft eye referred  [facility]   Provider: Dr. Olivia Mackie McLean-Scocuzza-Internal Medicine

## 2021-02-07 NOTE — Patient Instructions (Addendum)
Chelsey Lynch the interpreter did not come again today so in the future if you could come to appointments with you mom to translate  If hand pain/numbness continues will refer [facility] with orthopedics hand which may help   Tylenol as needed for headache pain  Lunesta 1/2 pill to 1 pill at night for sleep  Knee high compression socks [employer] coppertone for varicose veins wear during the day Call and schedule eye doctor   Lidocaine pain patch or Salonpas pain patch over the counter You can try a back brace   Eszopiclone tablets What is this medicine? ESZOPICLONE (es ZOE pi clone) is used to treat insomnia. This medicine helps you to fall asleep and sleep through the night. This medicine may be used for other purposes; ask your health care provider or pharmacist if you have questions. COMMON BRAND NAME(S): Lunesta What should I tell my health care provider before I take this medicine? They need to know if you have any of these conditions:  depression  history of a drug or alcohol abuse problem  liver disease  lung or breathing disease  sleep-walking, driving, eating or other activity while not fully awake after taking a sleep medicine  suicidal thoughts  an unusual or allergic reaction to eszopiclone, other medicines, foods, dyes, or preservatives  pregnant or trying to get pregnant  breast-feeding How should I use this medicine? Take this medicine by mouth with a glass of water. Follow the directions on the prescription label. It is better to take this medicine on an empty stomach and only when you are ready for bed. Do not take your medicine more often than directed. If you have been taking this medicine for several weeks and suddenly stop taking it, you may get unpleasant withdrawal symptoms. Your doctor or health care professional may want to gradually reduce the dose. Do not stop taking this medicine on your own. Always follow your doctor or health care professional's advice. Talk  to your pediatrician regarding the use of this medicine in children. Special care may be needed. Overdosage: If you think you have taken too much of this medicine contact a poison control center or emergency room at once. NOTE: This medicine is only for you. Do not share this medicine with others. What if I miss a dose? This does not apply. This medicine should only be taken immediately before going to sleep. Do not take double or extra doses. What may interact with this medicine?  herbal medicines like kava kava, melatonin, St. John's wort and valerian  lorazepam  medicines for fungal infections like ketoconazole, fluconazole, or itraconazole  olanzapine This list may not describe all possible interactions. Give your health care provider a list of all the medicines, herbs, non-prescription drugs, or dietary supplements you use. Also tell them if you smoke, drink alcohol, or use illegal drugs. Some items may interact with your medicine. What should I watch for while using this medicine? Visit your doctor or health care professional for regular checks on your progress. Keep a regular sleep schedule by going to bed at about the same time nightly. Avoid caffeine-containing drinks in the evening hours, as caffeine can cause trouble with falling asleep. Talk to your doctor if you still have trouble sleeping. After taking this medicine, you may get up out of bed and do an activity that you do not know you are doing. The next morning, you may have no memory of this. Activities include driving a car ("sleep-driving"), making and eating food,  talking on the phone, sexual activity, and sleep-walking. Serious injuries have occurred. Stop the medicine and call your doctor right away if you find out you have done any of these activities. Do not take this medicine if you have used alcohol that evening. Do not take it if you have taken another medicine for sleep. The risk of doing these sleep-related activities is  higher. Do not take this medicine unless you are able to stay in bed for a full night (7 to 8 hours) before you must be active again. You may have a decrease in mental alertness the day after use, even if you feel that you are fully awake. Tell your doctor if you will need to perform activities requiring full alertness, such as driving, the next day. Do not stand or sit up quickly after taking this medicine, especially if you are an older patient. This reduces the risk of dizzy or fainting spells. If you or your family notice any changes in your behavior, such as new or worsening depression, thoughts of harming yourself, anxiety, other unusual or disturbing thoughts, or memory loss, call your doctor right away. After you stop taking this medicine, you may have trouble falling asleep. This is called rebound insomnia. This problem usually goes away on its own after 1 or 2 nights. What side effects may I notice from receiving this medicine? Side effects that you should report to your doctor or health care professional as soon as possible:  allergic reactions like skin rash, itching or hives, swelling of the face, lips, or tongue  changes in vision  confusion  depressed mood  feeling faint or lightheaded, falls  hallucinations  problems with balance, speaking, walking  restlessness, excitability, or feelings of agitation  unusual activities while not fully awake like driving, eating, making phone calls Side effects that usually do not require medical attention (report to your doctor or health care professional if they continue or are bothersome):  dizziness, or daytime drowsiness, sometimes called a hangover effect  headache This list may not describe all possible side effects. Call your doctor for medical advice about side effects. You may report side effects to FDA at 1-800-FDA-1088. Where should I keep my medicine? Keep out of the reach of children. This medicine can be abused. Keep  your medicine in a safe place to protect it from theft. Do not share this medicine with anyone. Selling or giving away this medicine is dangerous and against the law. This medicine may cause accidental overdose and death if taken by other adults, children, or pets. Mix any unused medicine with a substance like cat litter or coffee grounds. Then throw the medicine away in a sealed container like a sealed bag or a coffee can with a lid. Do not use the medicine after the expiration date. Store at room temperature between 15 and 30 degrees C (59 and 86 degrees F). NOTE: This sheet is a summary. It may not cover all possible information. If you have questions about this medicine, talk to your doctor, pharmacist, or health care provider.  2021 Elsevier/Gold Standard (2018-04-15 11:57:05)   Varicose Veins Varicose veins are veins that have become enlarged, bulged, and twisted. They most often appear in the legs. What are the causes? This condition is caused by damage to the valves in the vein. These valves help blood return to your heart. When they are damaged and they stop working properly, blood may flow backward and back up in the veins near the skin, causing  the veins to get larger and appear twisted. The condition can result from any issue that causes blood to back up, like pregnancy, prolonged standing, or obesity. What increases the risk? This condition is more likely to develop in people who are:  On their feet a lot.  Pregnant.  Overweight. What are the signs or symptoms? Symptoms of this condition include:  Bulging, twisted, and bluish veins.  A feeling of heaviness. This may be worse at the end of the day.  Leg pain. This may be worse at the end of the day.  Swelling in the leg.  Changes in skin color over the veins. How is this diagnosed? This condition may be diagnosed based on your symptoms, a physical exam, and an ultrasound test. How is this treated? Treatment for this  condition may involve:  Avoiding sitting or standing in one position for long periods of time.  Wearing compression stockings. These stockings help to prevent blood clots and reduce swelling in the legs.  Raising (elevating) the legs when resting.  Losing weight.  Exercising regularly. If you have persistent symptoms or want to improve the way your varicose veins look, you may choose to have a procedure to close the varicose veins off or to remove them. Treatments to close off the veins include:  Sclerotherapy. In this treatment, a solution is injected into a vein to close it off.  Laser treatment. In this treatment, the vein is heated with a laser to close it off.  Radiofrequency vein ablation. In this treatment, an electrical current produced by radio waves is used to close off the vein. Treatments to remove the veins include:  Phlebectomy. In this treatment, the veins are removed through small incisions made over the veins.  Vein ligation and stripping. In this treatment, incisions are made over the veins. The veins are then removed after being tied (ligated) with stitches (sutures). Follow these instructions at home: Activity  Walk as much as possible. Walking increases blood flow. This helps blood return to the heart and takes pressure off your veins. It also increases your cardiovascular strength.  Follow your health care provider's instructions about exercising.  Do not stand or sit in one position for a long period of time.  Do not sit with your legs crossed.  Rest with your legs raised during the day. General instructions  Follow any diet instructions given to you by your health care provider.  Wear compression stockings as directed by your health care provider. Do not wear other kinds of tight clothing around your legs, pelvis, or waist.  Elevate your legs at night to above the level of your heart.  If you get a cut in the skin over the varicose vein and the vein  bleeds: ? Lie down with your leg raised. ? Apply firm pressure to the cut with a clean cloth until the bleeding stops. ? Place a bandage (dressing) on the cut.   Contact a health care provider if:  The skin around your varicose veins starts to break down.  You have pain, redness, tenderness, or hard swelling over a vein.  You are uncomfortable because of pain.  You get a cut in the skin over a varicose vein and it will not stop bleeding. Summary  Varicose veins are veins that have become enlarged, bulged, and twisted. They most often appear in the legs.  This condition is caused by damage to the valves in the vein. These valves help blood return to your heart.  Treatment for this condition includes frequent movements, wearing compression stockings, losing weight, and exercising regularly. In some cases, procedures are done to close off or remove the veins.  Treatment for this condition may include wearing compression stockings, elevating the legs, losing weight, and engaging in regular activity. In some cases, procedures are done to close off or remove the veins. This information is not intended to replace advice given to you by your health care provider. Make sure you discuss any questions you have with your health care provider. Document Revised: 02/29/2020 Document Reviewed: 02/29/2020 Elsevier Patient Education  2021 [place].   Insomnia Insomnia is a sleep disorder that makes it difficult to fall asleep or stay asleep. Insomnia can cause fatigue, low energy, difficulty concentrating, mood swings, and poor performance at work or school. There are three different ways to classify insomnia:  Difficulty falling asleep.  Difficulty staying asleep.  Waking up too early in the morning. Any type of insomnia can be long-term (chronic) or short-term (acute). Both are common. Short-term insomnia usually lasts for three months or less. Chronic insomnia occurs at least three times a  week for longer than three months. What are the causes? Insomnia may be caused by another condition, situation, or substance, such as:  Anxiety.  Certain medicines.  Gastroesophageal reflux disease (GERD) or other gastrointestinal conditions.  Asthma or other breathing conditions.  Restless legs syndrome, sleep apnea, or other sleep disorders.  Chronic pain.  Menopause.  Stroke.  Abuse of alcohol, tobacco, or illegal drugs.  Mental health conditions, such as depression.  Caffeine.  Neurological disorders, such as Alzheimer's disease.  An overactive thyroid (hyperthyroidism). Sometimes, the cause of insomnia may not be known. What increases the risk? Risk factors for insomnia include:  Gender. Women are affected more often than men.  Age. Insomnia is more common as you get older.  Stress.  Lack of exercise.  Irregular work schedule or working night shifts.  Traveling between different time zones.  Certain medical and mental health conditions. What are the signs or symptoms? If you have insomnia, the main symptom is having trouble falling asleep or having trouble staying asleep. This may lead to other symptoms, such as:  Feeling fatigued or having low energy.  Feeling nervous about going to sleep.  Not feeling rested in the morning.  Having trouble concentrating.  Feeling irritable, anxious, or depressed. How is this diagnosed? This condition may be diagnosed based on:  Your symptoms and medical history. Your health care provider may ask about: ? Your sleep habits. ? Any medical conditions you have. ? Your mental health.  A physical exam. How is this treated? Treatment for insomnia depends on the cause. Treatment may focus on treating an underlying condition that is causing insomnia. Treatment may also include:  Medicines to help you sleep.  Counseling or therapy.  Lifestyle adjustments to help you sleep better. Follow these instructions at  home: Eating and drinking  Limit or avoid alcohol, caffeinated beverages, and cigarettes, especially close to bedtime. These can disrupt your sleep.  Do not eat a large meal or eat spicy foods right before bedtime. This can lead to digestive discomfort that can make it hard for you to sleep.   Sleep habits  Keep a sleep diary to help you and your health care provider figure out what could be causing your insomnia. Write down: ? When you sleep. ? When you wake up during the night. ? How well you sleep. ? How rested you feel  the next day. ? Any side effects of medicines you are taking. ? What you eat and drink.  Make your bedroom a dark, comfortable place where it is easy to fall asleep. ? Put up shades or blackout curtains to block light from outside. ? Use a white noise machine to block noise. ? Keep the temperature cool.  Limit screen use before bedtime. This includes: ? Watching TV. ? Using your smartphone, tablet, or computer.  Stick to a routine that includes going to bed and waking up at the same times every day and night. This can help you fall asleep faster. Consider making a quiet activity, such as reading, part of your nighttime routine.  Try to avoid taking naps during the day so that you sleep better at night.  Get out of bed if you are still awake after 15 minutes of trying to sleep. Keep the lights down, but try reading or doing a quiet activity. When you feel sleepy, go back to bed.   General instructions  Take over-the-counter and prescription medicines only as told by your health care provider.  Exercise regularly, as told by your health care provider. Avoid exercise starting several hours before bedtime.  Use relaxation techniques to manage stress. Ask your health care provider to suggest some techniques that may work well for you. These may include: ? Breathing exercises. ? Routines to release muscle tension. ? Visualizing peaceful scenes.  Make sure that  you drive carefully. Avoid driving if you feel very sleepy.  Keep all follow-up visits as told by your health care provider. This is important. Contact a health care provider if:  You are tired throughout the day.  You have trouble in your daily routine due to sleepiness.  You continue to have sleep problems, or your sleep problems get worse. Get help right away if:  You have serious thoughts about hurting yourself or someone else. If you ever feel like you may hurt yourself or others, or have thoughts about taking your own life, get help right away. You can go to your nearest emergency department or call:  Your local emergency services (911 in the [place]  A suicide crisis helpline, such as the [facility] at (628)443-3797. This is open 24 hours a day. Summary  Insomnia is a sleep disorder that makes it difficult to fall asleep or stay asleep.  Insomnia can be long-term (chronic) or short-term (acute).  Treatment for insomnia depends on the cause. Treatment may focus on treating an underlying condition that is causing insomnia.  Keep a sleep diary to help you and your health care provider figure out what could be causing your insomnia. This information is not intended to replace advice given to you by your health care provider. Make sure you discuss any questions you have with your health care provider. Document Revised: 08/29/2020 Document Reviewed: 08/29/2020 Elsevier Patient Education  2021 [place].  Carpal Tunnel Syndrome  Carpal tunnel syndrome is a condition that causes pain, numbness, and weakness in your hand and fingers. The carpal tunnel is a narrow area located on the palm side of your wrist. Repeated wrist motion or certain diseases may cause swelling within the tunnel. This swelling pinches the main nerve in the wrist. The main nerve in the wrist is called the median nerve. What are the causes? This condition may be caused  by:  Repeated and forceful wrist and hand motions.  Wrist injuries.  Arthritis.  A cyst or tumor in the carpal  tunnel.  Fluid buildup during pregnancy.  Use of tools that vibrate. Sometimes the cause of this condition is not known. What increases the risk? The following factors may make you more likely to develop this condition:  Having a job that requires you to repeatedly or forcefully move your wrist or hand or requires you to use tools that vibrate. This may include jobs that involve using computers, working on an [employer], or working with [employer] such as Pension scheme manager.  Being a woman.  Having certain conditions, such as: ? Diabetes. ? Obesity. ? An underactive thyroid (hypothyroidism). ? Kidney failure. ? Rheumatoid arthritis. What are the signs or symptoms? Symptoms of this condition include:  A tingling feeling in your fingers, especially in your thumb, index, and middle fingers.  Tingling or numbness in your hand.  An aching feeling in your entire arm, especially when your wrist and elbow are bent for a long time.  Wrist pain that goes up your arm to your shoulder.  Pain that goes down into your palm or fingers.  A weak feeling in your hands. You may have trouble grabbing and holding items. Your symptoms may feel worse during the night. How is this diagnosed? This condition is diagnosed with a medical history and physical exam. You may also have tests, including:  Electromyogram (EMG). This test measures electrical signals sent by your nerves into the muscles.  Nerve conduction study. This test measures how well electrical signals pass through your nerves.  Imaging tests, such as X-rays, ultrasound, and MRI. These tests check for possible causes of your condition. How is this treated? This condition may be treated with:  Lifestyle changes. It is important to stop or change the activity that caused your condition.  Doing exercise and  activities to strengthen and stretch your muscles and tendons (physical therapy).  Making lifestyle changes to help with your condition and learning how to do your daily activities safely (occupational therapy).  Medicines for pain and inflammation. This may include medicine that is injected into your wrist.  A wrist splint or brace.  Surgery. Follow these instructions at home: If you have a splint or brace:  Wear the splint or brace as told by your health care provider. Remove it only as told by your health care provider.  Loosen the splint or brace if your fingers tingle, become numb, or turn cold and blue.  Keep the splint or brace clean.  If the splint or brace is not waterproof: ? Do not let it get wet. ? Cover it with a watertight covering when you take a bath or shower. Managing pain, stiffness, and swelling If directed, put ice on the painful area. To do this:  If you have a removeable splint or brace, remove it as told by your health care provider.  Put ice in a plastic bag.  Place a towel between your skin and the bag or between the splint or brace and the bag.  Leave the ice on for 20 minutes, 2-3 times a day. Do not fall asleep with the cold pack on your skin.  Remove the ice if your skin turns bright red. This is very important. If you cannot feel pain, heat, or cold, you have a greater risk of damage to the area. Move your fingers often to reduce stiffness and swelling.   General instructions  Take over-the-counter and prescription medicines only as told by your health care provider.  Rest your wrist and hand from any  activity that may be causing your pain. If your condition is work related, talk with your employer about changes that can be made, such as getting a wrist pad to use while typing.  Do any exercises as told by your health care provider, physical therapist, or occupational therapist.  Keep all follow-up visits. This is important. Contact a health  care provider if:  You have new symptoms.  Your pain is not controlled with medicines.  Your symptoms get worse. Get help right away if:  You have severe numbness or tingling in your wrist or hand. Summary  Carpal tunnel syndrome is a condition that causes pain, numbness, and weakness in your hand and fingers.  It is usually caused by repeated wrist motions.  Lifestyle changes and medicines are used to treat carpal tunnel syndrome. Surgery may be recommended.  Follow your health care provider's instructions about wearing a splint, resting from activity, keeping follow-up visits, and calling for help. This information is not intended to replace advice given to you by your health care provider. Make sure you discuss any questions you have with your health care provider. Document Revised: 02/29/2020 Document Reviewed: 02/29/2020 Elsevier Patient Education  [place].

## 2021-02-08 LAB — URINE CULTURE
MICRO NUMBER:: 11748677
SPECIMEN QUALITY:: ADEQUATE

## 2021-02-09 ENCOUNTER — Telehealth: Payer: Self-pay | Admitting: Internal Medicine

## 2021-02-09 NOTE — Telephone Encounter (Signed)
Pt wants copy of labs mailed to her thanks

## 2021-02-10 NOTE — Telephone Encounter (Signed)
Mailed

## 2021-02-11 ENCOUNTER — Telehealth: Payer: Self-pay | Admitting: Internal Medicine

## 2021-02-11 ENCOUNTER — Encounter: Payer: Self-pay | Admitting: *Deleted

## 2021-02-11 NOTE — Telephone Encounter (Signed)
Received a call from [facility] from [facility]. States that Patient was referred by Dr Olivia Mackie McLean-Scocuzza to be seen at [facility].   Patient is scheduled to be seen Thursday 02/13/21 at 2:20 pm. States that since [facility] is not a [facility] facility they will need a verbal okay and cost center from the referring provider's office.   Spoke with RN Supervisor Juliann Pulse and received okay to give cost center number and give verbal for Patient transport to this one appointment.   For further transportation to referred appointments outside [facility] Patient will need a CCM referral.   Please advise

## 2021-02-12 ENCOUNTER — Telehealth: Payer: Self-pay | Admitting: Internal Medicine

## 2021-02-12 NOTE — Telephone Encounter (Signed)
Prior authorization has been submitted for patient's Eszopiclone 1MG  tablets  Awaiting approval or denial.

## 2021-02-17 NOTE — Telephone Encounter (Signed)
No the office pays cost!!

## 2021-02-17 NOTE — Telephone Encounter (Signed)
Is the office ok with this?  Is this what we are supposed to do or can family take her to non [place] visits?

## 2021-02-17 NOTE — Telephone Encounter (Signed)
Patient will need CCM referral to take care of future transportation needs.

## 2021-02-17 NOTE — Telephone Encounter (Signed)
Verbal ok is fine is pt responsible for cost?

## 2021-02-18 ENCOUNTER — Other Ambulatory Visit: Payer: Self-pay | Admitting: Internal Medicine

## 2021-02-18 DIAGNOSIS — G4700 Insomnia, unspecified: Secondary | ICD-10-CM

## 2021-02-18 MED ORDER — BELSOMRA 5 MG PO TABS
5.0000 mg | ORAL_TABLET | Freq: Every evening | ORAL | 0 refills | Status: AC | PRN
Start: 2021-02-18 — End: ?

## 2021-02-18 NOTE — Addendum Note (Signed)
Addended by: Orland Mustard on: 02/18/2021 06:06 PM   Modules accepted: Orders

## 2021-02-18 NOTE — Telephone Encounter (Signed)
Okay to place ccm referral? If so I can put this in for you

## 2021-02-20 ENCOUNTER — Telehealth: Payer: Self-pay

## 2021-02-20 ENCOUNTER — Other Ambulatory Visit: Payer: Self-pay

## 2021-02-20 DIAGNOSIS — C49A0 Gastrointestinal stromal tumor, unspecified site: Secondary | ICD-10-CM

## 2021-02-20 NOTE — Telephone Encounter (Signed)
Agree Dr. Fronia Depass McLean-Scocuzza  

## 2021-02-20 NOTE — Telephone Encounter (Signed)
   Telephone encounter was:  Successful.  02/20/2021 Name: Chelsey Lynch MRN: [id number] DOB: 10-14-1946  Chelsey Lynch is a 75 y.o. year old female who is a primary care patient of McLean-Scocuzza, Nino Glow, MD . The community resource team was consulted for assistance with Transportation Needs   Care guide performed the following interventions: Patient provided with information about care guide support team and interviewed to confirm resource needs Spoke with patient's daughter Leta Baptist she asked that I email the information for Medicaid transportation to her..  Follow Up Plan:  Care guide will follow up with patient by phone over the next 7 days  Nikolina Simerson, AAS Paralegal, [place] . Embedded Care Coordination [facility]  Care Management  [address], [place] [address] ??[email or web address]  ?? [phone]   [email or web address]

## 2021-02-21 ENCOUNTER — Inpatient Hospital Stay: Payer: Medicare Other | Attending: Oncology

## 2021-02-21 ENCOUNTER — Ambulatory Visit
Admission: RE | Admit: 2021-02-21 | Discharge: 2021-02-21 | Disposition: A | Discharge status: Home / Self Care | Payer: Medicare Other | Source: Ambulatory Visit | Attending: Oncology | Admitting: Oncology

## 2021-02-21 ENCOUNTER — Other Ambulatory Visit: Payer: Self-pay

## 2021-02-21 DIAGNOSIS — R1013 Epigastric pain: Secondary | ICD-10-CM | POA: Insufficient documentation

## 2021-02-21 DIAGNOSIS — C49A0 Gastrointestinal stromal tumor, unspecified site: Secondary | ICD-10-CM

## 2021-02-21 DIAGNOSIS — C49A2 Gastrointestinal stromal tumor of stomach: Secondary | ICD-10-CM | POA: Insufficient documentation

## 2021-02-21 LAB — CBC WITH DIFFERENTIAL/PLATELET
Abs Immature Granulocytes: 0.01 10*3/uL (ref 0.00–0.07)
Basophils Absolute: 0 10*3/uL (ref 0.0–0.1)
Basophils Relative: 1 %
Eosinophils Absolute: 0.2 10*3/uL (ref 0.0–0.5)
Eosinophils Relative: 5 %
HCT: 39.5 % (ref 36.0–46.0)
Hemoglobin: 13.2 g/dL (ref 12.0–15.0)
Immature Granulocytes: 0 %
Lymphocytes Relative: 30 %
Lymphs Abs: 1.3 10*3/uL (ref 0.7–4.0)
MCH: 30.7 pg (ref 26.0–34.0)
MCHC: 33.4 g/dL (ref 30.0–36.0)
MCV: 91.9 fL (ref 80.0–100.0)
Monocytes Absolute: 0.4 10*3/uL (ref 0.1–1.0)
Monocytes Relative: 9 %
Neutro Abs: 2.4 10*3/uL (ref 1.7–7.7)
Neutrophils Relative %: 55 %
Platelets: 185 10*3/uL (ref 150–400)
RBC: 4.3 MIL/uL (ref 3.87–5.11)
RDW: 12.7 % (ref 11.5–15.5)
WBC: 4.4 10*3/uL (ref 4.0–10.5)
nRBC: 0 % (ref 0.0–0.2)

## 2021-02-21 LAB — COMPREHENSIVE METABOLIC PANEL
ALT: 42 U/L (ref 0–44)
AST: 31 U/L (ref 15–41)
Albumin: 4.3 g/dL (ref 3.5–5.0)
Alkaline Phosphatase: 57 U/L (ref 38–126)
Anion gap: 9 (ref 5–15)
BUN: 13 mg/dL (ref 8–23)
CO2: 27 mmol/L (ref 22–32)
Calcium: 9 mg/dL (ref 8.9–10.3)
Chloride: 96 mmol/L — ABNORMAL LOW (ref 98–111)
Creatinine, Ser: 0.66 mg/dL (ref 0.44–1.00)
GFR, Estimated: >60 mL/min (ref >60)
Glucose, Bld: 93 mg/dL (ref 70–99)
Potassium: 4 mmol/L (ref 3.5–5.1)
Sodium: 132 mmol/L — ABNORMAL LOW (ref 135–145)
Total Bilirubin: 0.9 mg/dL (ref 0.3–1.2)
Total Protein: 7.3 g/dL (ref 6.5–8.1)

## 2021-02-21 MED ORDER — IOHEXOL 300 MG/ML  SOLN
75.0000 mL | Freq: Once | INTRAMUSCULAR | Status: AC | PRN
  Administered 2021-02-21: 75 mL via INTRAVENOUS

## 2021-02-24 ENCOUNTER — Inpatient Hospital Stay (HOSPITAL_BASED_OUTPATIENT_CLINIC_OR_DEPARTMENT_OTHER): Payer: Medicare Other | Admitting: Oncology

## 2021-02-24 ENCOUNTER — Encounter: Payer: Self-pay | Admitting: Oncology

## 2021-02-24 VITALS — BP 126/67 | HR 53 | Temp 96.8°F | Resp 18 | Wt 121.8 lb

## 2021-02-24 DIAGNOSIS — C49A0 Gastrointestinal stromal tumor, unspecified site: Secondary | ICD-10-CM | POA: Diagnosis not present

## 2021-02-24 DIAGNOSIS — R1013 Epigastric pain: Secondary | ICD-10-CM | POA: Diagnosis not present

## 2021-02-24 DIAGNOSIS — C49A2 Gastrointestinal stromal tumor of stomach: Secondary | ICD-10-CM | POA: Diagnosis not present

## 2021-02-24 NOTE — Progress Notes (Signed)
Pt here for follow up. Pt reports abdominal pain and she believes it is from surgery in February.

## 2021-02-24 NOTE — Progress Notes (Signed)
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  Just was favored.  Patient was referred to [facility] Dr. Donnal Moat who recommended EUS with biopsy. 11/24/2019, patient underwent EUS biopsy. Pathology showed gastrointestinal stromal tumor, spindle cell type Patient also underwent colonoscopy on 11/30/2019.  Status post 1 colon polyp status resection.  Pathology showed tubular adenoma.  Left colon endoscopic biopsy showed chronic mucosa with ischemic pattern of 12/19/2019 patient underwent wedge gastrectomy Pathology showed GIST, spindle cell type, 3.2 cm, histology grade 2, high-grade, mitotic rate 11/92m2, Unifocal, margins were negative.  No lymph nodes were submitted.  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  Patient prefers to establish care locally for discussion of adjuvant treatment. Patient lost weight after surgery, appetite has improved.   also has experienced postsurgical abdominal discomfort at the lower abdomen which is improving.  Patient wears abdominal binder routinely.  Currently she is not taking oxycodone. Patient walks independently at home.  [place] Patient declined adjuvant Gleevec.  INTERVAL HISTORY Chelsey Lynch a 75y.o. female who has above history reviewed by me today presents for follow up visit for management of GIST Problems  and complaints are listed below: Patient presents to the clinic by herself.  She reports doing well.  Appetite is fair. Reports " frequent burping", and sometimes epigastric discomfort.  Not able to provide any further details about the pain.  She believes the pain is secondary to her surgery.  Review of Systems  Constitutional: Negative for fatigue.  Eyes: Negative for icterus.  Respiratory: Negative for cough and shortness of breath.   Cardiovascular: Negative for chest pain.  Gastrointestinal: Negative for abdominal distention.  Genitourinary: Negative for dysuria.   Musculoskeletal: Negative for arthralgias.  Skin: Negative for rash.  Neurological: Negative for headaches.  Hematological: Negative for adenopathy.  Psychiatric/Behavioral: Negative for confusion.    MEDICAL HISTORY:  Past Medical History:  Diagnosis Date  . Arthritis    left shoulder, right low back and right hip   . Hyperlipidemia   . Hypertension   . UTI (urinary tract infection)     SURGICAL HISTORY: Past Surgical History:  Procedure Laterality Date  . CATARACT EXTRACTION     b/l   . 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 Marital status: Divorced    Spouse name: Not on file  . Number of children: Not on file  . Years of education: Not on file  . Highest education level: Not on file  Occupational History  . Not on file  Tobacco Use  . Smoking status: Never Smoker  . Smokeless tobacco: Never Used  Substance and Sexual Activity  . Alcohol use: Not Currently  . Drug use: Not Currently  . Sexual activity: Not Currently  Other Topics Concern  . Not  on file  Social History Narrative   4 daughters    [place] here from [place].    Currently not working and pending insurance as of 06/30/19    From the [employer]    Social Determinants of Health   Financial Resource Strain:  Low Risk   . Difficulty of Paying Living Expenses: Not hard at all  Food Insecurity: No Food Insecurity  . Worried About Charity fundraiser in the Last Year: Never true  . Ran Out of Food in the Last Year: Never true  Transportation Needs: No Transportation Needs  . Lack of Transportation (Medical): No  . Lack of Transportation (Non-Medical): No  Physical Activity: Sufficiently Active  . Days of Exercise per Week: 7 days  . Minutes of Exercise per Session: 30 min  Stress: No Stress Concern Present  . Feeling of Stress : Only a little  Social Connections: Moderately Integrated  . Frequency of Communication with Friends and Family: Not on file  . Frequency of Social Gatherings with Friends and Family: More than three times a week  . Attends Religious Services: 1 to 4 times per year  . Active Member of Clubs or Organizations: Yes  . Attends Archivist Meetings: 1 to 4 times per year  . Marital Status: Divorced  Human resources officer Violence: Not At Risk  . Fear of Current or Ex-Partner: No  . Emotionally Abused: No  . Physically Abused: No  . Sexually Abused: No    FAMILY HISTORY: Family History  Problem Relation Age of Onset  . Breast cancer Other     ALLERGIES:  is allergic to acetaminophen, hctz [hydrochlorothiazide], and tylenol with codeine #3  [acetaminophen-codeine].  MEDICATIONS:  Current Outpatient Medications  Medication Sig Dispense Refill  . amLODipine (NORVASC) 2.5 MG tablet Take 1 tablet (2.5 mg total) by mouth daily. If BP>130/>80 90 tablet 3  . Ascorbic Acid (VITAMIN C PO) Take 1,000 mg by mouth daily.    Marland Kitchen aspirin EC 81 MG tablet Take 81 mg by mouth daily.    . B Complex Vitamins (VITAMIN B COMPLEX PO) Take by mouth.    . Cholecalciferol (VITAMIN D3) 50 MCG (2000 UT) capsule Take 5,000 Units by mouth daily.    Marland Kitchen lisinopril (ZESTRIL) 40 MG tablet Take 1 tablet (40 mg total) by mouth daily. D/c lis 20-hctz 25 90 tablet 3  . metoprolol tartrate  (LOPRESSOR) 50 MG tablet Take 1 tablet (50 mg total) by mouth 2 (two) times daily. 180 tablet 3  . multivitamin-lutein (OCUVITE-LUTEIN) CAPS capsule Take 1 capsule by mouth daily.    . Olopatadine HCl 0.2 % SOLN Apply 1 drop to eye daily. 2.5 mL 1  . Omega-3 Fatty Acids (OMEGA 3 PO) Take 600 mg by mouth daily.    . simvastatin (ZOCOR) 20 MG tablet TAKE 1 TABLET BY MOUTH ONCE DAILY AT  6PM 90 tablet 3  . imatinib (GLEEVEC) 400 MG tablet Take 1 tablet (400 mg total) by mouth daily. Take with meals and large glass of water.Caution:Chemotherapy. (Patient not taking: Reported on 02/24/2021) 30 tablet 1  . Suvorexant (BELSOMRA) 5 MG TABS Take 5 mg by mouth at bedtime as needed. (Patient not taking: Reported on 02/24/2021) 30 tablet 0   No current facility-administered medications for this visit.     PHYSICAL EXAMINATION: ECOG PERFORMANCE STATUS: 0 - Asymptomatic Vitals:   02/24/21 1002  BP: 126/67  Pulse: (!) 53  Resp: 18  Temp: (!) 96.8 F (36 C)  Filed Weights   02/24/21 1002  Weight: 121 lb 12.8 oz (55.2 kg)    Physical Exam Constitutional:      General: She is not in acute distress.    Comments: Patient walks independently  HENT:     Head: Normocephalic and atraumatic.  Eyes:     General: No scleral icterus. Cardiovascular:     Rate and Rhythm: Normal rate and regular rhythm.     Heart sounds: Normal heart sounds.  Pulmonary:     Effort: Pulmonary effort is normal. No respiratory distress.     Breath sounds: No wheezing.  Abdominal:     General: Bowel sounds are normal. There is no distension.     Palpations: Abdomen is soft.  Musculoskeletal:        General: No deformity. Normal range of motion.     Cervical back: Normal range of motion and neck supple.  Skin:    General: Skin is warm and dry.     Findings: No erythema or rash.  Neurological:     Mental Status: She is alert and oriented to person, place, and time. Mental status is at baseline.     Cranial Nerves: No  cranial nerve deficit.     Coordination: Coordination normal.  Psychiatric:        Mood and Affect: Mood normal.     LABORATORY DATA:  I have reviewed the data as listed Lab Results  Component Value Date   WBC 4.4 02/21/2021   HGB 13.2 02/21/2021   HCT 39.5 02/21/2021   MCV 91.9 02/21/2021   PLT 185 02/21/2021   Recent Labs    08/15/20 1259 02/07/21 1249 02/21/21 1014  NA 135 139 132*  K 4.6 4.4 4.0  CL 99 103 96*  CO2 _0 GLUCOSE 101* 82 93  BUN _1 CREATININE 0.83 0.81 0.66  CALCIUM 9.1 9.5 9.0  GFRNONAA >60  --  >60  PROT 7.5 7.3 7.3  ALBUMIN 4.2 4.4 4.3  AST _2 ALT 35 45* 42  ALKPHOS 47 63 57  BILITOT 1.1 0.6 0.9   Iron/TIBC/Ferritin/ %Sat    Component Value Date/Time   IRON 65 01/26/2020 1610   TIBC 312 01/26/2020 1610   FERRITIN 200 01/26/2020 1610   IRONPCTSAT 21 01/26/2020 1610      RADIOGRAPHIC STUDIES: I have personally reviewed the radiological images as listed and agreed with the findings in the report. CT Abdomen Pelvis W Contrast  Result Date: 02/21/2021 CLINICAL DATA:  History of gastric GIST resection in February 2021. EXAM: CT ABDOMEN AND PELVIS WITH CONTRAST TECHNIQUE: Multidetector CT imaging of the abdomen and pelvis was performed using the standard protocol following bolus administration of intravenous contrast. CONTRAST:  37m OMNIPAQUE IOHEXOL 300 MG/ML  SOLN COMPARISON:  Prior CT scans from 2021 and 2020. The most recent exam is 08/15/2020. FINDINGS: Lower chest: The lung bases are clear of an acute process. No worrisome pulmonary nodules to suggest metastatic disease. The heart is borderline enlarged but stable. No pericardial effusion. Hepatobiliary: Stable segment 8, 4 cm hemangioma and stable hepatic cysts. No worrisome hepatic lesions to suggest metastatic disease. The gallbladder is unremarkable. Stable mild common bile duct dilatation. Pancreas: No mass, inflammation or ductal dilatation. Spleen: Normal size.  No  focal lesions. Adrenals/Urinary Tract: The adrenal glands and kidneys are unremarkable. Small scattered cysts but no worrisome renal lesions or hydronephrosis. Bladder is unremarkable. Stomach/Bowel: Stable surgical changes involving the stomach related to  the prior GIST resection. No findings suspicious for recurrent tumor. The duodenum, small bowel and colon are unremarkable. There is moderate stool in the colon and rectum. The terminal ileum and appendix are normal. Vascular/Lymphatic: Stable atherosclerotic calcifications involving the aorta and iliac arteries but no aneurysm or dissection. The branch vessels are patent. The major venous structures are patent. No mesenteric or retroperitoneal mass or adenopathy. Reproductive: The uterus and ovaries are unremarkable. Other: No pelvic mass or adenopathy. No free pelvic fluid collections. No inguinal mass or adenopathy. No abdominal wall hernia or subcutaneous lesions. Musculoskeletal: No significant bony findings. IMPRESSION: 1. Stable surgical changes involving the stomach related to the prior GIST resection. No findings suspicious for recurrent tumor, locoregional adenopathy or metastatic disease. 2. Stable hepatic hemangioma and hepatic cysts. 3. Moderate stool in the colon and rectum. 4. Aortic atherosclerosis. Aortic Atherosclerosis (ICD10-I70.0). Electronically Signed   By: Marijo Sanes M.D.   On: 02/21/2021 16:36      ASSESSMENT & PLAN:  1. Gastrointestinal stromal tumor (GIST) ([place])   2. Epigastric pain    Pathology was reviewed and discussed with patient daughter. GIST of stomach, status post resection -Tumor size 3.2 cm, high mitotic rate 11/36m2, rare exon 13 Mutation. Patient was recommended to start aHerrickwhich she declined. Surveillance CT images were independently reviewed by me and discussed with patient .  No evidence of recurrence.  Labs are reviewed and discussed with patient. Recommend repeat CT in 6  months.  Epigastric pain, history of wedge gastrectomy Recommend patient to follow-up with gastroenterology for evaluation. Patient would like to discuss with daughter to see if she would like to establish care with local [facility]ology [facility]  Her daughter to update me about her decision.    We spent sufficient time to discuss many aspect of care, questions were answered to patient's satisfaction.  Online interpreter service was utilized for the entire encounter.  Orders Placed This Encounter  Procedures  . CT ABDOMEN PELVIS W CONTRAST    Standing Status:   Future    Standing Expiration Date:   02/24/2022    Order Specific Question:   If indicated for the ordered procedure, I authorize the administration of contrast media per Radiology protocol    Answer:   Yes    Order Specific Question:   Preferred imaging location?    Answer:   [facility]    Order Specific Question:   Is Oral Contrast requested for this exam?    Answer:   Yes, Per Radiology protocol    Order Specific Question:   Radiology Contrast Protocol - do NOT remove file path    Answer:   [email or web address]  . CBC with Differential/Platelet    Standing Status:   Future    Standing Expiration Date:   02/24/2022  . Comprehensive metabolic panel    Standing Status:   Future    Standing Expiration Date:   02/24/2022    All questions were answered. The patient knows to call the clinic with any problems questions or concerns. Return of visit: 6 months ZEarlie Server MD, PhD Hematology Oncology [facility] HospitalPager- [phone]/25/2022

## 2021-02-25 ENCOUNTER — Encounter: Payer: Self-pay | Admitting: Internal Medicine

## 2021-02-25 ENCOUNTER — Telehealth: Payer: Self-pay

## 2021-02-25 NOTE — Telephone Encounter (Signed)
   Telephone encounter was:  Unsuccessful.  02/25/2021 Name: Shailynn Fong MRN: [id number] DOB: [birth year]  Unsuccessful outbound call made today to assist with:  Transportation Needs   Outreach Attempt:  2nd Attempt   Unable to leave message daughter's voicemail is full. Called to see if she has received email with Medicaid transportation information.  Ashley Bultema, AAS Paralegal, [place] . Embedded Care Coordination [facility]  Care Management  [address], [place] [address] ??[email or web address]  ?? [phone]   [email or web address]

## 2021-02-27 ENCOUNTER — Telehealth: Payer: Self-pay

## 2021-02-27 NOTE — Telephone Encounter (Signed)
   Telephone encounter was:  Unsuccessful.  02/27/2021 Name: Michon Kaczmarek MRN: [id number] DOB: [birth year]  Unsuccessful outbound call made today to assist with:  Transportation Needs   Outreach Attempt:  3rd Attempt.  Referral closed unable to contact patient.  Unable to leave message daughter's voicemail is full  Wafa Martes, AAS Paralegal, [place] . Embedded Care Coordination [facility]  Care Management  [address], [place] [address] ??[email or web address]  ?? [phone]   [email or web address]

## 2021-02-28 ENCOUNTER — Other Ambulatory Visit: Payer: Self-pay | Admitting: Otolaryngology

## 2021-02-28 DIAGNOSIS — G501 Atypical facial pain: Secondary | ICD-10-CM

## 2021-02-28 DIAGNOSIS — J0100 Acute maxillary sinusitis, unspecified: Secondary | ICD-10-CM

## 2021-03-07 NOTE — Telephone Encounter (Signed)
We denied this request under Medicare Part D because: The requested drug is not on your plan's formulary  (list of covered drugs). Your Medicare Part D drug plan was asked to cover a drug that is not on the formulary  (this is called a formulary exception). Your prescriber did not provide the detailed information that is required in  order to approve the request. To receive a formulary exception, your prescriber must provide information that  documents at least one of the following has occurred:  - You have tried the formulary drugs for the treatment of your condition and they did not work for you. OR - The formulary drugs could cause adverse effects. OR - The formulary drugs would be less effective for your condition than the requested drug. Talk to your prescriber to see if the following covered alternative(s) would be right for you:  zolpidem tartrate tablet (requires prior authorization for members of age 33 or greater) (quantity limit of 30  tablets every 30 days)

## 2021-03-11 NOTE — Telephone Encounter (Signed)
I sent in belsombra did she get this ?if not agreeable to try ambien for sleep?

## 2021-03-13 ENCOUNTER — Telehealth: Payer: Self-pay

## 2021-03-13 ENCOUNTER — Ambulatory Visit (INDEPENDENT_AMBULATORY_CARE_PROVIDER_SITE_OTHER): Payer: Medicare Other | Admitting: Internal Medicine

## 2021-03-13 ENCOUNTER — Ambulatory Visit
Admission: RE | Admit: 2021-03-13 | Discharge: 2021-03-13 | Disposition: A | Discharge status: Home / Self Care | Payer: Medicare Other | Source: Ambulatory Visit | Attending: Internal Medicine | Admitting: Internal Medicine

## 2021-03-13 ENCOUNTER — Other Ambulatory Visit: Payer: Self-pay

## 2021-03-13 DIAGNOSIS — I10 Essential (primary) hypertension: Secondary | ICD-10-CM

## 2021-03-13 DIAGNOSIS — G8929 Other chronic pain: Secondary | ICD-10-CM | POA: Diagnosis present

## 2021-03-13 DIAGNOSIS — R519 Headache, unspecified: Secondary | ICD-10-CM | POA: Insufficient documentation

## 2021-03-13 DIAGNOSIS — G501 Atypical facial pain: Secondary | ICD-10-CM | POA: Diagnosis present

## 2021-03-13 DIAGNOSIS — J0100 Acute maxillary sinusitis, unspecified: Secondary | ICD-10-CM | POA: Insufficient documentation

## 2021-03-13 NOTE — Telephone Encounter (Signed)
Spoke with pt's daughter [name] and let her know that we needed to reschedule her appt. [name] states that she wanted the pt to have a virtual visit instead of in office because it is better for her (Judy's) work schedule. It is scheduled for 03/21/21. She states that she will be with the pt on the virtual call and apologizes that she can not come into the office. I let pt know that it is important for the pt to be seen. FYI

## 2021-03-13 NOTE — Telephone Encounter (Signed)
Noted  For your information   

## 2021-03-14 ENCOUNTER — Ambulatory Visit: Payer: Medicare Other

## 2021-03-17 NOTE — Progress Notes (Signed)
Had to reschedule appt due to no translator nor family at the visit   Dr. Olivia Mackie McLean-Scocuzza

## 2021-03-17 NOTE — Telephone Encounter (Signed)
No answer, no voicemail.

## 2021-03-21 ENCOUNTER — Encounter: Payer: Self-pay | Admitting: Internal Medicine

## 2021-03-21 ENCOUNTER — Telehealth (INDEPENDENT_AMBULATORY_CARE_PROVIDER_SITE_OTHER): Payer: Medicare Other | Admitting: Internal Medicine

## 2021-03-21 VITALS — Ht 62.0 in | Wt 121.8 lb

## 2021-03-21 DIAGNOSIS — I10 Essential (primary) hypertension: Secondary | ICD-10-CM

## 2021-03-21 DIAGNOSIS — G8929 Other chronic pain: Secondary | ICD-10-CM

## 2021-03-21 DIAGNOSIS — R059 Cough, unspecified: Secondary | ICD-10-CM

## 2021-03-21 DIAGNOSIS — R0982 Postnasal drip: Secondary | ICD-10-CM | POA: Diagnosis not present

## 2021-03-21 DIAGNOSIS — R0989 Other specified symptoms and signs involving the circulatory and respiratory systems: Secondary | ICD-10-CM

## 2021-03-21 DIAGNOSIS — Z23 Encounter for immunization: Secondary | ICD-10-CM | POA: Diagnosis not present

## 2021-03-21 DIAGNOSIS — M546 Pain in thoracic spine: Secondary | ICD-10-CM

## 2021-03-21 DIAGNOSIS — G4700 Insomnia, unspecified: Secondary | ICD-10-CM

## 2021-03-21 MED ORDER — TETANUS-DIPHTH-ACELL PERTUSSIS 5-2.5-18.5 LF-MCG/0.5 IM SUSP: 0.5000 mL | Freq: Once | INTRAMUSCULAR | 0 refills | Status: AC

## 2021-03-21 MED ORDER — METOPROLOL SUCCINATE ER 25 MG PO TB24: 25.0000 mg | ORAL_TABLET | Freq: Every day | ORAL | 3 refills | Status: DC

## 2021-03-21 NOTE — Progress Notes (Signed)
Patient is established with [facility].   Would like to discuss cutting metoprolol in half. Taking 1 tablet once daily for know.   Would like to discuss vaccinations. 4th COVID shot and Pneumonia shot, TDAP.

## 2021-03-21 NOTE — Telephone Encounter (Signed)
Patient being seen virtually today at 3:30. Will discuss medication then

## 2021-03-21 NOTE — Patient Instructions (Addendum)
Try Astelin 2 sprays 2x per day as needed if this does not work you can try Atrovent prescription let me know  Let me know if you want chest Xray for cough  Let me know if you want to change lisinopril to losartan due to cough. 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 Take belsombra as needed for sleep but if not needed do not take   Cough, Adult Coughing is a reflex that clears your throat and your airways (respiratory system). Coughing helps to heal and protect your lungs. It is normal to cough occasionally, but a cough that happens with other symptoms or lasts a long time may be a sign of a condition that needs treatment. An acute cough may only last 2-3 weeks, while a chronic cough may last 8 or more weeks. Coughing is commonly caused by:  Infection of the respiratory systemby viruses or bacteria.  Breathing in substances that irritate your lungs.  Allergies.  Asthma.  Mucus that runs down the back of your throat (postnasal drip).  Smoking.  Acid backing up from the stomach into the esophagus (gastroesophageal reflux).  Certain medicines I.e lisinopril  Chronic lung problems.  Other medical conditions such as heart failure or a blood clot in the lung (pulmonary embolism). Follow these instructions at home: Medicines  Take over-the-counter and prescription medicines only as told by your health care provider.  Talk with your health care provider before you take a cough suppressant medicine. Lifestyle  Avoid cigarette smoke. Do not use any products that contain nicotine or tobacco, such as cigarettes, e-cigarettes, and chewing tobacco. If you need help  quitting, ask your health care provider.  Drink enough fluid to keep your urine pale yellow.  Avoid caffeine.  Do not drink alcohol if your health care provider tells you not to drink.   General instructions  Pay close attention to changes in your cough. Tell your health care provider about them.  Always cover your mouth when you cough.  Avoid things that make you cough, such as perfume, candles, cleaning products, or campfire or tobacco smoke.  If the air is dry, use a cool mist vaporizer or humidifier in your bedroom or your home to help loosen secretions.  If your cough is worse at night, try to sleep in a semi-upright position.  Rest as needed.  Keep all follow-up visits as told by your health care provider. This is important.   Contact a health care provider if you:  Have new symptoms.  Cough up pus.  Have a cough that does not get better after 2-3 weeks or gets worse.  Cannot control your cough with cough suppressant medicines and you are losing sleep.  Have pain that gets worse or pain that is not helped with medicine.  Have a fever.  Have unexplained weight loss.  Have night sweats. Get help right away if:  You cough up blood.  You have difficulty breathing.  Your heartbeat is very fast. These symptoms may represent a serious problem that is an emergency. Do not wait to see if the symptoms will go away. Get medical  help right away. Call your local emergency services (911 in the [place] Do not drive yourself to the hospital. Summary  Coughing is a reflex that clears your throat and your airways. It is normal to cough occasionally, but a cough that happens with other symptoms or lasts a long time may be a sign of a condition that needs treatment.  Take over-the-counter and prescription medicines only as told by your health care provider.  Always cover your mouth when you cough.  Contact a health care provider if you have new symptoms or a cough that does  not get better after 2-3 weeks or gets worse. This information is not intended to replace advice given to you by your health care provider. Make sure you discuss any questions you have with your health care provider. Document Revised: 11/07/2018 Document Reviewed: 11/07/2018 Elsevier Patient Education  [employer].  Tdap (Tetanus, Diphtheria, Pertussis) Vaccine: What You Need to Know 1. Why get vaccinated? Tdap vaccine can prevent tetanus, diphtheria, and pertussis. Diphtheria and pertussis spread from person to person. Tetanus enters the body through cuts or wounds.  TETANUS (T) causes painful stiffening of the muscles. Tetanus can lead to serious health problems, including being unable to open the mouth, having trouble swallowing and breathing, or death.  DIPHTHERIA (D) can lead to difficulty breathing, heart failure, paralysis, or death.  PERTUSSIS (aP), also known as "whooping cough," can cause uncontrollable, violent coughing that makes it hard to breathe, eat, or drink. Pertussis can be extremely serious especially in babies and young children, causing pneumonia, convulsions, brain damage, or death. In teens and adults, it can cause weight loss, loss of bladder control, passing out, and rib fractures from severe coughing. 2. Tdap vaccine Tdap is only for children 7 years and older, adolescents, and adults.  Adolescents should receive a single dose of Tdap, preferably at age 58 or 58 years. Pregnant people should get a dose of Tdap during every pregnancy, preferably during the early part of the third trimester, to help protect the newborn from pertussis. Infants are most at risk for severe, life-threatening complications from pertussis. Adults who have never received Tdap should get a dose of Tdap. Also, adults should receive a booster dose of either Tdap or Td (a different vaccine that protects against tetanus and diphtheria but not pertussis) every 10 years, or after 5 years in the  case of a severe or dirty wound or burn. Tdap may be given at the same time as other vaccines. 3. Talk with your health care provider Tell your vaccine provider if the person getting the vaccine:  Has had an allergic reaction after a previous dose of any vaccine that protects against tetanus, diphtheria, or pertussis, or has any severe, life-threatening allergies  Has had a coma, decreased level of consciousness, or prolonged seizures within 7 days after a previous dose of any pertussis vaccine (DTP, DTaP, or Tdap)  Has seizures or another nervous system problem  Has ever had Guillain-Barr Syndrome (also called "GBS")  Has had severe pain or swelling after a previous dose of any vaccine that protects against tetanus or diphtheria In some cases, your health care provider may decide to postpone Tdap vaccination until a future visit. People with minor illnesses, such as a cold, may be vaccinated. People who are moderately or severely ill should usually wait until they recover before getting Tdap vaccine.  Your health care provider can give you more information. 4. Risks of a vaccine reaction  Pain, redness,  or swelling where the shot was given, mild fever, headache, feeling tired, and nausea, vomiting, diarrhea, or stomachache sometimes happen after Tdap vaccination. People sometimes faint after medical procedures, including vaccination. Tell your provider if you feel dizzy or have vision changes or ringing in the ears.  As with any medicine, there is a very remote chance of a vaccine causing a severe allergic reaction, other serious injury, or death. 5. What if there is a serious problem? An allergic reaction could occur after the vaccinated person leaves the clinic. If you see signs of a severe allergic reaction (hives, swelling of the face and throat, difficulty breathing, a fast heartbeat, dizziness, or weakness), call 9-1-1 and get the person to the nearest hospital. For other signs that  concern you, call your health care provider.  Adverse reactions should be reported to the Vaccine Adverse Event Reporting System (VAERS). Your health care provider will usually file this report, or you can do it yourself. Visit the VAERS website at www.vaers.SamedayNews.es or call 825-656-6416. VAERS is only for reporting reactions, and VAERS staff members do not give medical advice. 6. The National Vaccine Injury Compensation Program The [employer] Vaccine Injury Compensation Program (VICP) is a federal program that was created to compensate people who may have been injured by certain vaccines. Claims regarding alleged injury or death due to vaccination have a time limit for filing, which may be as short as two years. Visit the VICP website at GoldCloset.com.ee or call 970-759-8786 to learn about the program and about filing a claim. 7. How can I learn more?  Ask your health care provider.  Call your local or state health department.  Visit the website of the Food and Drug Administration (FDA) for vaccine package inserts and additional information at TraderRating.uy.  Contact the Centers for Disease Control and Prevention (CDC): ? Call 3132707744 (1-800-CDC-INFO) or ? Visit CDC's website at http://hunter.com/. Vaccine Information Statement Tdap (Tetanus, Diphtheria, Pertussis) Vaccine (06/07/2020) This information is not intended to replace advice given to you by your health care provider. Make sure you discuss any questions you have with your health care provider. Document Revised: 07/03/2020 Document Reviewed: 07/03/2020 Elsevier Patient Education  2021 [employer].

## 2021-03-24 ENCOUNTER — Telehealth: Payer: Self-pay

## 2021-03-24 NOTE — Progress Notes (Signed)
Virtual Visit via Video Note  I connected with Chelsey Lynch  on 03/21/21 at  3:3=50 PM EDT by a video enabled telemedicine application and verified that I am speaking with the correct person using two identifiers.  Location patient: home, [place] Location provider:work or home office Persons participating in the virtual visit: patient, provider ,pts daughter Chelsey Lynch who is translating   I discussed the limitations of evaluation and management by telemedicine and the availability of in person appointments. The patient expressed understanding and agreed to proceed.   HPI: 1. Disc BP meds on norvasc 2.5 mg qd, lis 40 mg qd, metoprolol 50 mg Rx bid but taking 1/2 pill qd daughter wants to  Know if ok to take bb this way  BP has been 117/61, 117/62, 109/70, 111/68, 134/78   2. Vaccine discussion wants to consider 4th covid 10 vaccine, Tdap, pna 23 and prevnar she had   3. Pt c/o dry cough and had allergy testing [place] ent and negative environmental testing  Disc lis 40 mg qd can cause cough vs PND  4. C/o runny nose she using astelin but not multiple times per day and not helping   5. C/o mid back pain Xray in the past + arthritis   6. Insomnia resolved not taking belsombra or not need to take this 5 mg qhs  -COVID-19 vaccine status: 3/3  ROS: See pertinent positives and negatives per HPI.  Past Medical History:  Diagnosis Date  . Arthritis    left shoulder, right low back and right hip   . Hyperlipidemia   . Hypertension   . UTI (urinary tract infection)     Past Surgical History:  Procedure Laterality Date  . CATARACT EXTRACTION     b/l   . 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  amLODipine (NORVASC) 2.5 MG tablet, Take 1 tablet (2.5 mg total) by mouth daily. If BP>130/>80, Disp: 90 tablet, Rfl: 3 .  Ascorbic Acid (VITAMIN C PO), Take 1,000 mg by mouth daily., Disp: , Rfl:  .   aspirin EC 81 MG tablet, Take 81 mg by mouth daily., Disp: , Rfl:  .  azelastine (ASTELIN) 0.1 % nasal spray, Place 2 sprays into both nostrils 2 (two) times daily. Use in each nostril as directed, Disp: , Rfl:  .  B Complex Vitamins (VITAMIN B COMPLEX PO), Take by mouth., Disp: , Rfl:  .  Cholecalciferol (VITAMIN D3) 50 MCG (2000 UT) capsule, Take 5,000 Units by mouth daily., Disp: , Rfl:  .  lisinopril (ZESTRIL) 40 MG tablet, Take 1 tablet (40 mg total) by mouth daily. D/c lis 20-hctz 25, Disp: 90 tablet, Rfl: 3 .  metoprolol succinate (TOPROL-XL) 25 MG 24 hr tablet, Take 1 tablet (25 mg total) by mouth daily., Disp: 90 tablet, Rfl: 3 .  multivitamin-lutein (OCUVITE-LUTEIN) CAPS capsule, Take 1 capsule by mouth daily., Disp: , Rfl:  .  Omega-3 Fatty Acids (OMEGA 3 PO), Take 600 mg by mouth daily., Disp: , Rfl:  .  Polyethyl Glycol-Propyl Glycol (SYSTANE OP), Apply to eye., Disp: , Rfl:  .  simvastatin (ZOCOR) 20 MG tablet, TAKE 1 TABLET BY MOUTH ONCE DAILY AT  6PM, Disp: 90 tablet, Rfl: 3 .  Suvorexant (BELSOMRA) 5 MG TABS, Take 5 mg by mouth at bedtime as needed. (Patient not taking: No sig reported), Disp: 30 tablet, Rfl: 0  EXAM:  VITALS  per patient if applicable:  GENERAL: alert, oriented, appears well and in no acute distress  HEENT: atraumatic, conjunttiva clear, no obvious abnormalities on inspection of external nose and ears  NECK: normal movements of the head and neck  LUNGS: on inspection no signs of respiratory distress, breathing rate appears normal, no obvious gross SOB, gasping or wheezing  CV: no obvious cyanosis  MS: moves all visible extremities without noticeable abnormality  PSYCH/NEURO: pleasant and cooperative, no obvious depression or anxiety, speech and thought processing grossly intact  ASSESSMENT AND PLAN:  Discussed the following assessment and plan:  Hypertension, controlled- Plan: metoprolol succinate (TOPROL-XL) 25 MG 24 hr tablet d/c metoprolol  tartrate 25 mg qd  norvasc 2.5 mg qd  Lis 40 mg qd  Consider changing acei to arb due to cough in the future daughter will let me know Monitor BP  Cough  ?PND (post-nasal drip) vs med related  ent allergy testing negative  Let me know if wants to try atrovent in the future  Max astelin dose for now   Chronic bilateral thoracic back pain Prn Tylenol lidocaine/salonpas otc   Insomnia, resolved  Did not have to use belsombra 5 mg qhs    HM Flu shot utd covid3/3pfizer consider booster 7 or 06/2021 before trip out of the country utd prevnar, pna 23  Rx tdap to pharmacy Consider shingrix  mammo referredwill ask daughter Chelsey Lynch to scheduleorder in since 09/2020  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 Range 02/07/2021 12:49  Hemoglobin A1C Latest Ref Range: 4.6 - 6.5 % 5.9   Former PCP Dr. 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 Mont Dutton  H/o Dr. Tasia Catchings [place] [facility] referred  [facility]     -we discussed possible serious and likely etiologies, options for evaluation and workup, limitations of telemedicine visit vs in person visit, treatment, treatment risks and precautions. Pt prefers to treat via telemedicine empirically rather than in person at this moment.      I discussed the assessment and treatment plan with the patient. The patient was provided an opportunity to ask questions and all were answered. The patient agreed with the plan and demonstrated an understanding of the instructions.    Time spent 20 min [name], MD

## 2021-03-24 NOTE — Telephone Encounter (Signed)
Called and spoke with pt's daughter [name] about making mother 59m follow up appt in office. She states that she would call back later this afternoon to schedule that appt. FYI

## 2021-05-09 ENCOUNTER — Encounter: Payer: Self-pay | Admitting: Internal Medicine

## 2021-07-21 ENCOUNTER — Other Ambulatory Visit: Payer: Medicare Other

## 2021-08-19 ENCOUNTER — Other Ambulatory Visit: Payer: Self-pay

## 2021-08-19 ENCOUNTER — Inpatient Hospital Stay: Payer: Medicare Other | Attending: Oncology

## 2021-08-19 DIAGNOSIS — Z7982 Long term (current) use of aspirin: Secondary | ICD-10-CM | POA: Insufficient documentation

## 2021-08-19 DIAGNOSIS — Z79899 Other long term (current) drug therapy: Secondary | ICD-10-CM | POA: Insufficient documentation

## 2021-08-19 DIAGNOSIS — C49A2 Gastrointestinal stromal tumor of stomach: Secondary | ICD-10-CM | POA: Diagnosis not present

## 2021-08-19 DIAGNOSIS — C49A0 Gastrointestinal stromal tumor, unspecified site: Secondary | ICD-10-CM

## 2021-08-19 LAB — CBC WITH DIFFERENTIAL/PLATELET
Abs Immature Granulocytes: 0.01 10*3/uL (ref 0.00–0.07)
Basophils Absolute: 0.1 10*3/uL (ref 0.0–0.1)
Basophils Relative: 1 %
Eosinophils Absolute: 0.2 10*3/uL (ref 0.0–0.5)
Eosinophils Relative: 4 %
HCT: 39.5 % (ref 36.0–46.0)
Hemoglobin: 13 g/dL (ref 12.0–15.0)
Immature Granulocytes: 0 %
Lymphocytes Relative: 27 %
Lymphs Abs: 1.1 10*3/uL (ref 0.7–4.0)
MCH: 30.5 pg (ref 26.0–34.0)
MCHC: 32.9 g/dL (ref 30.0–36.0)
MCV: 92.7 fL (ref 80.0–100.0)
Monocytes Absolute: 0.3 10*3/uL (ref 0.1–1.0)
Monocytes Relative: 9 %
Neutro Abs: 2.3 10*3/uL (ref 1.7–7.7)
Neutrophils Relative %: 59 %
Platelets: 203 10*3/uL (ref 150–400)
RBC: 4.26 MIL/uL (ref 3.87–5.11)
RDW: 12.6 % (ref 11.5–15.5)
WBC: 3.9 10*3/uL — ABNORMAL LOW (ref 4.0–10.5)
nRBC: 0 % (ref 0.0–0.2)

## 2021-08-19 LAB — COMPREHENSIVE METABOLIC PANEL
ALT: 23 U/L (ref 0–44)
AST: 24 U/L (ref 15–41)
Albumin: 4 g/dL (ref 3.5–5.0)
Alkaline Phosphatase: 58 U/L (ref 38–126)
Anion gap: 6 (ref 5–15)
BUN: 15 mg/dL (ref 8–23)
CO2: 31 mmol/L (ref 22–32)
Calcium: 9.1 mg/dL (ref 8.9–10.3)
Chloride: 101 mmol/L (ref 98–111)
Creatinine, Ser: 0.89 mg/dL (ref 0.44–1.00)
GFR, Estimated: >60 mL/min (ref >60)
Glucose, Bld: 109 mg/dL — ABNORMAL HIGH (ref 70–99)
Potassium: 4.2 mmol/L (ref 3.5–5.1)
Sodium: 138 mmol/L (ref 135–145)
Total Bilirubin: 0.5 mg/dL (ref 0.3–1.2)
Total Protein: 7.1 g/dL (ref 6.5–8.1)

## 2021-08-20 ENCOUNTER — Ambulatory Visit
Admission: RE | Admit: 2021-08-20 | Discharge: 2021-08-20 | Disposition: A | Discharge status: Home / Self Care | Payer: Medicare Other | Source: Ambulatory Visit | Attending: Oncology | Admitting: Oncology

## 2021-08-20 DIAGNOSIS — C49A0 Gastrointestinal stromal tumor, unspecified site: Secondary | ICD-10-CM | POA: Insufficient documentation

## 2021-08-20 MED ORDER — IOHEXOL 300 MG/ML  SOLN
100.0000 mL | Freq: Once | INTRAMUSCULAR | Status: AC | PRN
  Administered 2021-08-20: 100 mL via INTRAVENOUS

## 2021-08-26 ENCOUNTER — Other Ambulatory Visit: Payer: Self-pay

## 2021-08-26 ENCOUNTER — Inpatient Hospital Stay (HOSPITAL_BASED_OUTPATIENT_CLINIC_OR_DEPARTMENT_OTHER): Payer: Medicare Other | Admitting: Oncology

## 2021-08-26 ENCOUNTER — Encounter: Payer: Self-pay | Admitting: Oncology

## 2021-08-26 VITALS — BP 126/82 | HR 60 | Temp 96.6°F | Wt 121.0 lb

## 2021-08-26 DIAGNOSIS — C49A0 Gastrointestinal stromal tumor, unspecified site: Secondary | ICD-10-CM | POA: Diagnosis not present

## 2021-08-26 DIAGNOSIS — R1013 Epigastric pain: Secondary | ICD-10-CM

## 2021-08-26 DIAGNOSIS — C49A2 Gastrointestinal stromal tumor of stomach: Secondary | ICD-10-CM | POA: Diagnosis not present

## 2021-08-26 NOTE — Progress Notes (Signed)
Hematology/Oncology follow up note [facility] [phone] [phone]   Patient Care Team: McLean-Scocuzza, Pasty Spillers, MD as PCP - General (Internal Medicine)  REFERRING PROVIDER: McLean-Scocuzza, French Ana *  CHIEF COMPLAINTS/REASON FOR VISIT:  Follow up for GIST  HISTORY OF PRESENTING ILLNESS:   Chelsey Lynch is a  75 y.o.  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  Just was favored.  Patient was referred to [facility] Dr. Tracey Harries who recommended EUS with biopsy. 11/24/2019, patient underwent EUS biopsy. Pathology showed gastrointestinal stromal tumor, spindle cell type Patient also underwent colonoscopy on 11/30/2019.  Status post 1 colon polyp status resection.  Pathology showed tubular adenoma.  Left colon endoscopic biopsy showed chronic mucosa with ischemic pattern of 12/19/2019 patient underwent wedge gastrectomy Pathology showed GIST, spindle cell type, 3.2 cm, histology grade 2, high-grade, mitotic rate 11/57mm2, Unifocal, margins were negative.  No lymph nodes were submitted.  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  Patient prefers to establish care locally for discussion of adjuvant treatment. Patient lost weight after surgery, appetite has improved.   also has experienced postsurgical abdominal discomfort at the lower abdomen which is improving.  Patient wears abdominal binder routinely.  Currently she is not taking oxycodone. Patient walks independently at home.  Lives with daughter in [place]. Patient declined adjuvant Gleevec.  INTERVAL HISTORY Chelsey Lynch is a 75 y.o. female who has above history reviewed by me today presents for follow up visit for management of  GIST Problems and complaints are listed below: Patient presents to the clinic by herself.  She reports doing well.  Appetite is fair. Patient continues to have intermittent epigastric discomfort/[place].  She has not followed up with gastroenterology.  Review of Systems  Constitutional:  Negative for fatigue.  Eyes:  Negative for icterus.  Respiratory:  Negative for cough and shortness of breath.   Cardiovascular:  Negative for chest pain.  Gastrointestinal:  Negative for abdominal distention.  Genitourinary:  Negative for dysuria.   Musculoskeletal:  Negative for arthralgias.  Skin:  Negative for rash.  Neurological:  Negative for headaches.  Hematological:  Negative for adenopathy.  Psychiatric/Behavioral:  Negative for confusion.    MEDICAL HISTORY:  Past Medical History:  Diagnosis Date   Arthritis    left shoulder, right low back and right hip    Hyperlipidemia    Hypertension    UTI (urinary tract infection)     SURGICAL HISTORY: Past Surgical History:  Procedure Laterality Date   CATARACT EXTRACTION     b/l    Gastrointestinal stromal tumor (GIST) of stomach  12/19/2019   [facility] 12/19/19 KIT exon 13 detected +    SOCIAL HISTORY: Social History   Socioeconomic History   Marital status: Divorced    Spouse name: Not on file   Number of children: Not on file   Years of education: Not on file   Highest education level: Not on file  Occupational History   Not on file  Tobacco Use   Smoking status: Never   Smokeless tobacco: Never  Substance and Sexual Activity   Alcohol use: Not Currently   Drug use: Not Currently   Sexual activity: Not Currently  Other Topics Concern   Not on file  Social History Narrative   4 daughters    Moved here from [place], [place].    Currently not working and pending insurance as of 06/30/19    From the [employer]    Social Determinants of Health   Financial Resource Strain: Low Risk    Difficulty of Paying Living Expenses: Not hard  at all  Food Insecurity: No Food Insecurity   Worried About Charity fundraiser in the Last Year: Never true   Arboriculturist in the Last Year: Never true  Transportation Needs: No Transportation Needs   Lack of Transportation (Medical): No   Lack of Transportation (Non-Medical): No  Physical Activity: Sufficiently Active   Days of Exercise per Week: 7 days   Minutes of Exercise per Session: 30 min  Stress: No Stress Concern Present   Feeling of Stress : Only a little  Social Connections: Moderately Integrated   Frequency of Communication with Friends and Family: Not on file   Frequency of Social Gatherings with Friends and Family: More than three times a week   Attends Religious Services: 1 to 4 times per year   Active Member of [employer] or Organizations: Yes   Attends Archivist Meetings: 1 to 4 times per year   Marital Status: Divorced  Human resources officer Violence: Not At Risk   Fear of Current or Ex-Partner: No   Emotionally Abused: No   Physically Abused: No   Sexually Abused: No    FAMILY HISTORY: Family History  Problem Relation Age of Onset   Breast cancer Other     ALLERGIES:  is allergic to acetaminophen, hctz [hydrochlorothiazide], and tylenol with codeine #3  [acetaminophen-codeine].  MEDICATIONS:  Current Outpatient Medications  Medication Sig Dispense Refill   amLODipine (NORVASC) 2.5 MG tablet Take 1 tablet (2.5 mg total) by mouth daily. If BP>130/>80 90 tablet 3   Ascorbic Acid (VITAMIN C PO) Take 1,000 mg by mouth daily.     aspirin EC 81 MG tablet Take 81 mg by mouth daily.     azelastine (ASTELIN) 0.1 % nasal spray Place 2 sprays into both nostrils 2 (two) times daily. Use in each nostril as directed     B Complex Vitamins (VITAMIN B COMPLEX PO) Take by mouth.     Cholecalciferol (VITAMIN D3) 50 MCG (2000 UT) capsule Take 5,000 Units by mouth daily.     lisinopril (ZESTRIL) 40 MG tablet Take 1 tablet (40 mg total) by mouth daily. D/c lis 20-hctz 25  90 tablet 3   metoprolol succinate (TOPROL-XL) 25 MG 24 hr tablet Take 1 tablet (25 mg total) by mouth daily. 90 tablet 3   multivitamin-lutein (OCUVITE-LUTEIN) CAPS capsule Take 1 capsule by mouth daily.     Omega-3 Fatty Acids (OMEGA 3 PO) Take 600 mg by mouth daily.     Polyethyl Glycol-Propyl Glycol (SYSTANE OP) Apply to eye.     simvastatin (ZOCOR) 20 MG tablet TAKE 1 TABLET BY MOUTH ONCE DAILY AT  6PM 90 tablet 3   Suvorexant (BELSOMRA) 5 MG TABS Take 5 mg by mouth at bedtime as needed. (Patient not taking: No sig reported) 30 tablet 0   No current facility-administered medications for this visit.     PHYSICAL EXAMINATION: ECOG PERFORMANCE STATUS: 0 - Asymptomatic Vitals:   08/26/21 1033  BP: 126/82  Pulse: 60  Temp: (!) 96.6 F (35.9 C)   Filed Weights   08/26/21 1033  Weight: 121 lb (54.9 kg)    Physical Exam Constitutional:  General: She is not in acute distress.    Comments: Patient walks independently  HENT:     Head: Normocephalic and atraumatic.  Eyes:     General: No scleral icterus. Cardiovascular:     Rate and Rhythm: Normal rate and regular rhythm.     Heart sounds: Normal heart sounds.  Pulmonary:     Effort: Pulmonary effort is normal. No respiratory distress.     Breath sounds: No wheezing.  Abdominal:     General: Bowel sounds are normal. There is no distension.     Palpations: Abdomen is soft.  Musculoskeletal:        General: No deformity. Normal range of motion.     Cervical back: Normal range of motion and neck supple.  Skin:    General: Skin is warm and dry.     Findings: No erythema or rash.  Neurological:     Mental Status: She is alert and oriented to person, place, and time. Mental status is at baseline.     Cranial Nerves: No cranial nerve deficit.     Coordination: Coordination normal.  Psychiatric:        Mood and Affect: Mood normal.    LABORATORY DATA:  I have reviewed the data as listed Lab Results  Component Value  Date   WBC 3.9 (L) 08/19/2021   HGB 13.0 08/19/2021   HCT 39.5 08/19/2021   MCV 92.7 08/19/2021   PLT 203 08/19/2021   Recent Labs    02/07/21 1249 02/21/21 1014 08/19/21 1037  NA 139 132* 138  K 4.4 4.0 4.2  CL 103 96* 101  CO2 $Re'31 27 31  'nzn$ GLUCOSE 82 93 109*  BUN $Re'15 13 15  'zVF$ CREATININE 0.81 0.66 0.89  CALCIUM 9.5 9.0 9.1  GFRNONAA  --  >60 >60  PROT 7.3 7.3 7.1  ALBUMIN 4.4 4.3 4.0  AST $Re'31 31 24  'OVC$ ALT 45* 42 23  ALKPHOS 63 57 58  BILITOT 0.6 0.9 0.5    Iron/TIBC/Ferritin/ %Sat    Component Value Date/Time   IRON 65 01/26/2020 1610   TIBC 312 01/26/2020 1610   FERRITIN 200 01/26/2020 1610   IRONPCTSAT 21 01/26/2020 1610      RADIOGRAPHIC STUDIES: I have personally reviewed the radiological images as listed and agreed with the findings in the report. CT ABDOMEN PELVIS W CONTRAST  Result Date: 08/21/2021 CLINICAL DATA:  GIST. Pelvic pain for 6 months with worsening today. EXAM: CT ABDOMEN AND PELVIS WITH CONTRAST TECHNIQUE: Multidetector CT imaging of the abdomen and pelvis was performed using the standard protocol following bolus administration of intravenous contrast. CONTRAST:  131mL OMNIPAQUE IOHEXOL 300 MG/ML  SOLN COMPARISON:  02/21/2021. FINDINGS: Lower chest: 4 mm nodular density in the posterior left lower lobe (4/4), unchanged. Heart is enlarged. No pericardial or pleural effusion. Distal esophagus is unremarkable. Hepatobiliary: Heterogeneous mass in the periphery of the right hepatic lobe measures 2.4 x 3.9 cm (8/10), stable and previously characterized as a hemangioma. Additional low-attenuation lesions in the liver measure up to 2.6 x 2.8 cm in the left hepatic lobe (8/9), stable. Liver and gallbladder are otherwise unremarkable. No biliary ductal dilatation. Pancreas: Negative. Spleen: Negative. Adrenals/Urinary Tract: Adrenal glands are unremarkable. Subcentimeter low-attenuation lesions in the kidneys are too small to characterize but statistically, cysts are  likely. Ureters are decompressed. Bladder is grossly unremarkable. Stomach/Bowel: Distal gastrectomy. Stomach, small bowel, appendix and colon are otherwise unremarkable. Vascular/Lymphatic: Atherosclerotic calcification of the aorta. No pathologically enlarged lymph nodes. Reproductive: Uterus  is visualized.  No adnexal mass. Other: No free fluid.  Mesenteries and peritoneum are unremarkable. Musculoskeletal: Probable scattered bone islands in the pelvis. Degenerative changes in the spine. No worrisome lytic or sclerotic lesions. IMPRESSION: 1. No findings to explain the patient's pelvic pain. No evidence of recurrent or metastatic disease. 2.  Aortic atherosclerosis (ICD10-I70.0). Electronically Signed   By: Lorin Picket M.D.   On: 08/21/2021 14:44       ASSESSMENT & PLAN:  1. Gastrointestinal stromal tumor (GIST) [facility])    Pathology was reviewed and discussed with patient daughter. GIST of stomach, status post resection -Tumor size 3.2 cm, high mitotic rate 11/66mm2, rare exon 13 Mutation. Patient declined adjuvant Gleevec. Surveillance CT images were independently reviewed by me and discussed with patient and her daughter over the phone. No evidence of recurrence. Labs are reviewed and are discussed with patient Recommend patient to repeat CT abdomen pelvis in 6 months.  Epigastric pain, history of wedge gastrectomy Recommend patient to follow-up with gastroenterology for evaluation. Patient is undecided.  I called patient's daughter and updated her.  Daughter agrees with establish care with local GI group.  Will refer to GI Dr. Vicente Males.   We spent sufficient time to discuss many aspect of care, questions were answered to patient's satisfaction. Online interpreter service for Tomma Rakers was utilized for the entire encounter.  Orders Placed This Encounter  Procedures   CT Abdomen Pelvis W Contrast    Standing Status:   Future    Standing Expiration Date:   08/26/2022    Order Specific  Question:   If indicated for the ordered procedure, I authorize the administration of contrast media per Radiology protocol    Answer:   Yes    Order Specific Question:   Preferred imaging location?    Answer:   [facility]    Order Specific Question:   Is Oral Contrast requested for this exam?    Answer:   Yes, Per Radiology protocol   CBC with Differential/Platelet    Standing Status:   Future    Standing Expiration Date:   08/26/2022   Comprehensive metabolic panel    Standing Status:   Future    Standing Expiration Date:   08/26/2022   Ambulatory referral to Gastroenterology    Referral Priority:   Routine    Referral Type:   Consultation    Referral Reason:   Specialty Services Required    Referred to Provider:   Jonathon Bellows, MD    Number of Visits Requested:   1    All questions were answered. The patient knows to call the clinic with any problems questions or concerns.  Return of visit: 6 months Earlie Server, MD, PhD Hematology Oncology [place] at [facility]  08/26/2021

## 2021-09-24 ENCOUNTER — Ambulatory Visit (INDEPENDENT_AMBULATORY_CARE_PROVIDER_SITE_OTHER): Payer: Medicare Other | Admitting: Internal Medicine

## 2021-09-24 ENCOUNTER — Other Ambulatory Visit: Payer: Self-pay

## 2021-09-24 ENCOUNTER — Encounter: Payer: Self-pay | Admitting: Internal Medicine

## 2021-09-24 VITALS — BP 126/80 | HR 61 | Temp 97.4°F | Ht 62.0 in | Wt 122.2 lb

## 2021-09-24 DIAGNOSIS — R1084 Generalized abdominal pain: Secondary | ICD-10-CM

## 2021-09-24 DIAGNOSIS — Z23 Encounter for immunization: Secondary | ICD-10-CM | POA: Diagnosis not present

## 2021-09-24 DIAGNOSIS — R109 Unspecified abdominal pain: Secondary | ICD-10-CM

## 2021-09-24 DIAGNOSIS — H6123 Impacted cerumen, bilateral: Secondary | ICD-10-CM | POA: Diagnosis not present

## 2021-09-24 DIAGNOSIS — K9041 Non-celiac gluten sensitivity: Secondary | ICD-10-CM | POA: Diagnosis not present

## 2021-09-24 DIAGNOSIS — K838 Other specified diseases of biliary tract: Secondary | ICD-10-CM

## 2021-09-24 DIAGNOSIS — C49A0 Gastrointestinal stromal tumor, unspecified site: Secondary | ICD-10-CM

## 2021-09-24 DIAGNOSIS — L659 Nonscarring hair loss, unspecified: Secondary | ICD-10-CM

## 2021-09-24 DIAGNOSIS — Z1283 Encounter for screening for malignant neoplasm of skin: Secondary | ICD-10-CM

## 2021-09-24 DIAGNOSIS — E2839 Other primary ovarian failure: Secondary | ICD-10-CM

## 2021-09-24 DIAGNOSIS — I10 Essential (primary) hypertension: Secondary | ICD-10-CM

## 2021-09-24 DIAGNOSIS — L299 Pruritus, unspecified: Secondary | ICD-10-CM

## 2021-09-24 DIAGNOSIS — Z1231 Encounter for screening mammogram for malignant neoplasm of breast: Secondary | ICD-10-CM

## 2021-09-24 DIAGNOSIS — E785 Hyperlipidemia, unspecified: Secondary | ICD-10-CM

## 2021-09-24 DIAGNOSIS — R7303 Prediabetes: Secondary | ICD-10-CM

## 2021-09-24 LAB — URINALYSIS, ROUTINE W REFLEX MICROSCOPIC
Bilirubin Urine: NEGATIVE
Hgb urine dipstick: NEGATIVE
Ketones, ur: NEGATIVE
Nitrite: NEGATIVE
Specific Gravity, Urine: 1.01 (ref 1.000–1.030)
Total Protein, Urine: NEGATIVE
Urine Glucose: NEGATIVE
Urobilinogen, UA: 0.2 (ref 0.0–1.0)
pH: 6.5 (ref 5.0–8.0)

## 2021-09-24 MED ORDER — SIMVASTATIN 20 MG PO TABS: ORAL_TABLET | ORAL | 3 refills | Status: AC

## 2021-09-24 MED ORDER — METOPROLOL SUCCINATE ER 25 MG PO TB24: 25.0000 mg | ORAL_TABLET | Freq: Every day | ORAL | 3 refills | Status: AC

## 2021-09-24 MED ORDER — CLOBETASOL PROPIONATE 0.05 % EX SOLN: 1.0000 "application " | Freq: Two times a day (BID) | CUTANEOUS | 2 refills | Status: DC

## 2021-09-24 MED ORDER — LISINOPRIL 40 MG PO TABS: 40.0000 mg | ORAL_TABLET | Freq: Every day | ORAL | 3 refills | Status: DC

## 2021-09-24 MED ORDER — AMLODIPINE BESYLATE 2.5 MG PO TABS: 2.5000 mg | ORAL_TABLET | Freq: Every day | ORAL | 3 refills | Status: DC

## 2021-09-24 MED ORDER — DICYCLOMINE HCL 20 MG PO TABS: 20.0000 mg | ORAL_TABLET | Freq: Three times a day (TID) | ORAL | 0 refills | Status: DC

## 2021-09-24 NOTE — Progress Notes (Signed)
Patient presented for ear irrigation due to cerumen impaction. Patient was informed of the possible side effects of having their ear flushed; light headedness, dizziness, nausea, vomiting and rupture ear drum. Items sed or that can be used are the elephant pump, catch basin, ear curettes, hydrogen peroxide (half a bottle for ear irrigation solution), and a stool softener (1-2CC for softening ear wax). Patient has given a verbal consent to have ear irrigation. patient voiced no concerns nor showed any signs of distress during procedure. Ear canal has become visibly clear bilaterally.

## 2021-09-24 NOTE — Progress Notes (Signed)
Chief Complaint  Patient presents with   Follow-up   F/u with daughter  1. Co intermittent lower ab pain cramping randomly w/o food happens 2-3 pm since November ct scan 08/2021 reviewed no etiology of pain  2. BP readings low 100s sbp to mid 60s to <130/<80 at times heart is racing norvasc 2.5 mg qd on toprol xl 25 mg qd and lis 40 mg qd  Hld on zocor 20 mg qhs Hold norvasc if BP <100/<60 advised today  Had labs in [place] 8/30/222  uric acid 409, creatine 0.88 bun 13.50  Lipid TC 150 trigs 78.99 HDL 66.02, ldl 69.51  Ast 32, alt 23  A1c 5.8  WBC 4.51 h/h 12.2/36.2 plts 171  UA normal 3. Hair loss she is dyeing her hair nothing tried  4. US abdomen 07/01/21 liver cysts extrahepatic biliary ductal dilation 0.8 cm  correlation MRI suggested renal cysts   Review of Systems  Constitutional:  Negative for weight loss.  HENT:  Negative for hearing loss.   Eyes:  Negative for blurred vision.  Respiratory:  Negative for shortness of breath.   Cardiovascular:  Negative for chest pain.  Gastrointestinal:  Positive for abdominal pain. Negative for blood in stool.  Genitourinary:  Negative for dysuria.  Musculoskeletal:  Negative for falls and joint pain.  Skin:  Negative for rash.  Neurological:  Negative for headaches.  Psychiatric/Behavioral:  Negative for depression.   Past Medical History:  Diagnosis Date   Arthritis    left shoulder, right low back and right hip    Hyperlipidemia    Hypertension    UTI (urinary tract infection)    Past Surgical History:  Procedure Laterality Date   CATARACT EXTRACTION     b/l    Gastrointestinal stromal tumor (GIST) of stomach  12/19/2019   [facility] 12/19/19 KIT exon 13 detected +   Family History  Problem Relation Age of Onset   Breast cancer Other    Social History   Socioeconomic History   Marital status: Divorced    Spouse name: Not on file   Number of children: Not on file   Years of education: Not on file   Highest education  level: Not on file  Occupational History   Not on file  Tobacco Use   Smoking status: Never   Smokeless tobacco: Never  Substance and Sexual Activity   Alcohol use: Not Currently   Drug use: Not Currently   Sexual activity: Not Currently  Other Topics Concern   Not on file  Social History Narrative   4 daughters    [place] here from [place], [place].    Currently not working and pending insurance as of 06/30/19    From the [employer]    Social Determinants of Health   Financial Resource Strain: Low Risk    Difficulty of Paying Living Expenses: Not hard at all  Food Insecurity: No Food Insecurity   Worried About Charity fundraiser in the Last Year: Never true   Arboriculturist in the Last Year: Never true  Transportation Needs: No Transportation Needs   Lack of Transportation (Medical): No   Lack of Transportation (Non-Medical): No  Physical Activity: Sufficiently Active   Days of Exercise per Week: 7 days   Minutes of Exercise per Session: 30 min  Stress: No Stress Concern Present   Feeling of Stress : Only a little  Social Connections: Moderately Integrated   Frequency of Communication with Friends and Family: Not on  file   Frequency of Social Gatherings with Friends and Family: More than three times a week   Attends Religious Services: 1 to 4 times per year   Active Member of [employer] or Organizations: Yes   Attends Archivist Meetings: 1 to 4 times per year   Marital Status: Divorced  Human resources officer Violence: Not At Risk   Fear of Current or Ex-Partner: No   Emotionally Abused: No   Physically Abused: No   Sexually Abused: No   Current Meds  Medication Sig   Ascorbic Acid (VITAMIN C PO) Take 1,000 mg by mouth daily.   aspirin EC 81 MG tablet Take 81 mg by mouth daily.   azelastine (ASTELIN) 0.1 % nasal spray Place 2 sprays into both nostrils 2 (two) times daily. Use in each nostril as directed   B Complex Vitamins (VITAMIN B COMPLEX PO) Take by mouth.    Cholecalciferol (VITAMIN D3) 50 MCG (2000 UT) capsule Take 5,000 Units by mouth daily.   clobetasol (TEMOVATE) 0.05 % external solution Apply 1 application topically 2 (two) times daily. As needed x 2 week on and 2 weeks off   dicyclomine (BENTYL) 20 MG tablet Take 1 tablet (20 mg total) by mouth 4 (four) times daily -  before meals and at bedtime.   multivitamin-lutein (OCUVITE-LUTEIN) CAPS capsule Take 1 capsule by mouth daily.   Omega-3 Fatty Acids (OMEGA 3 PO) Take 600 mg by mouth daily.   Polyethyl Glycol-Propyl Glycol (SYSTANE OP) Apply to eye.   Suvorexant (BELSOMRA) 5 MG TABS Take 5 mg by mouth at bedtime as needed.   [DISCONTINUED] amLODipine (NORVASC) 2.5 MG tablet Take 1 tablet (2.5 mg total) by mouth daily. If BP>130/>80   [DISCONTINUED] lisinopril (ZESTRIL) 40 MG tablet Take 1 tablet (40 mg total) by mouth daily. D/c lis 20-hctz 25   [DISCONTINUED] metoprolol succinate (TOPROL-XL) 25 MG 24 hr tablet Take 1 tablet (25 mg total) by mouth daily.   [DISCONTINUED] simvastatin (ZOCOR) 20 MG tablet TAKE 1 TABLET BY MOUTH ONCE DAILY AT  6PM   Allergies  Allergen Reactions   Acetaminophen Other (See Comments)    Cold sweats, general malaise   Hctz [Hydrochlorothiazide]     Hyponatremia    Tylenol With Codeine #3  [Acetaminophen-Codeine]     Other reaction(s): Unknown   Recent Results (from the past 2160 hour(s))  Comprehensive metabolic panel     Status: Abnormal   Collection Time: 08/19/21 10:37 AM  Result Value Ref Range   Sodium 138 135 - 145 mmol/L   Potassium 4.2 3.5 - 5.1 mmol/L   Chloride 101 98 - 111 mmol/L   CO2 31 22 - 32 mmol/L   Glucose, Bld 109 (H) 70 - 99 mg/dL    Comment: Glucose reference range applies only to samples taken after fasting for at least 8 hours.   BUN 15 8 - 23 mg/dL   Creatinine, Ser 0.89 0.44 - 1.00 mg/dL   Calcium 9.1 8.9 - 10.3 mg/dL   Total Protein 7.1 6.5 - 8.1 g/dL   Albumin 4.0 3.5 - 5.0 g/dL   AST 24 15 - 41 U/L   ALT 23 0 - 44 U/L    Alkaline Phosphatase 58 38 - 126 U/L   Total Bilirubin 0.5 0.3 - 1.2 mg/dL   GFR, Estimated >60 >60 mL/min    Comment: (NOTE) Calculated using the CKD-EPI Creatinine Equation (2021)    Anion gap 6 5 - 15    Comment: Performed at  [facility], [address] [place], [place] [address]  CBC with Differential/Platelet     Status: Abnormal   Collection Time: 08/19/21 10:37 AM  Result Value Ref Range   WBC 3.9 (L) 4.0 - 10.5 K/uL   RBC 4.26 3.87 - 5.11 MIL/uL   Hemoglobin 13.0 12.0 - 15.0 g/dL   HCT 39.5 36.0 - 46.0 %   MCV 92.7 80.0 - 100.0 fL   MCH 30.5 26.0 - 34.0 pg   MCHC 32.9 30.0 - 36.0 g/dL   RDW 12.6 11.5 - 15.5 %   Platelets 203 150 - 400 K/uL   nRBC 0.0 0.0 - 0.2 %   Neutrophils Relative % 59 %   Neutro Abs 2.3 1.7 - 7.7 K/uL   Lymphocytes Relative 27 %   Lymphs Abs 1.1 0.7 - 4.0 K/uL   Monocytes Relative 9 %   Monocytes Absolute 0.3 0.1 - 1.0 K/uL   Eosinophils Relative 4 %   Eosinophils Absolute 0.2 0.0 - 0.5 K/uL   Basophils Relative 1 %   Basophils Absolute 0.1 0.0 - 0.1 K/uL   Immature Granulocytes 0 %   Abs Immature Granulocytes 0.01 0.00 - 0.07 K/uL    Comment: Performed at [facility], [address] [place], [place] [address]   Objective  Body mass index is 22.35 kg/m. Wt Readings from Last 3 Encounters:  09/24/21 122 lb 3.2 oz (55.4 kg)  08/26/21 121 lb (54.9 kg)  03/21/21 121 lb 12.8 oz (55.2 kg)   Temp Readings from Last 3 Encounters:  09/24/21 (!) 97.4 F (36.3 C) (Temporal)  08/26/21 (!) 96.6 F (35.9 C) (Tympanic)  02/24/21 (!) 96.8 F (36 C)   BP Readings from Last 3 Encounters:  09/24/21 126/80  08/26/21 126/82  02/24/21 126/67   Pulse Readings from Last 3 Encounters:  09/24/21 61  08/26/21 60  02/24/21 (!) 53    Physical Exam Vitals and nursing note reviewed.  Constitutional:      Appearance: Normal appearance. She is well-developed and well-groomed.  HENT:     Head: Normocephalic and atraumatic.     Right  Ear: There is impacted cerumen.     Left Ear: There is impacted cerumen.  Eyes:     Conjunctiva/sclera: Conjunctivae normal.     Pupils: Pupils are equal, round, and reactive to light.  Cardiovascular:     Rate and Rhythm: Normal rate and regular rhythm.     Heart sounds: Normal heart sounds. No murmur heard. Pulmonary:     Effort: Pulmonary effort is normal.     Breath sounds: Normal breath sounds.  Abdominal:     General: Abdomen is flat. Bowel sounds are normal.     Tenderness: There is no abdominal tenderness.  Musculoskeletal:        General: No tenderness.  Skin:    General: Skin is warm and dry.  Neurological:     General: No focal deficit present.     Mental Status: She is alert and oriented to person, place, and time. Mental status is at baseline.     Cranial Nerves: Cranial nerves 2-12 are intact.     Gait: Gait is intact.  Psychiatric:        Attention and Perception: Attention and perception normal.        Mood and Affect: Mood and affect normal.        Speech: Speech normal.        Behavior: Behavior normal. Behavior is cooperative.  Thought Content: Thought content normal.        Cognition and Memory: Cognition and memory normal.        Judgment: Judgment normal.    Assessment  Plan  Generalized abdominal pain  ct reviewed 08/2021 no etiology r/o uti ? Gluten intolerance vs ab cramping  Plan: Urinalysis, Routine w reflex microscopic, Urine Culture Prn bentyl  Gluten intolerance - Plan: Celiac Disease Antibody Screen  Hair loss - Plan: clobetasol (TEMOVATE) 0.05 % external solution, Ambulatory referral to Dermatology And for tbse  Rogaine for women  Hair skin and nail vitamin  Vital protein collagen  Rosemary oil -[employer] brand or add drops of rosemary 5 drops to shampoo   Essential hypertension controlled - Plan: lisinopril (ZESTRIL) 40 MG tablet, amLODipine (NORVASC) 2.5 MG tablet (TOPROL-XL) 25 MG 24 hr tablet  Hyperlipidemia,  unspecified hyperlipidemia type - Plan: simvastatin (ZOCOR) 20 MG tablet  Prediabetes A1C 5.8 07/01/21   Bilateral impacted cerumen  Consented and tolerated b/l ear lavage with cma and currette all wax removed   Hm  Flu shot utd today covid 3/3 pfizer consider booster rec Tdap, shingrix, prevnar if not had  She does report she had pna 23 vaccine 04/30/19 but no record   mammo referred will ask daughter [name] to schedule with DEXA Colonoscopy 11/30/19 tubular adenoma [facility]  Out of age window pap  Consider DEXA in future will order for pt to schedule Will need referral to eye MD when gets insurance right eye bothersome to her      Ref. Range 02/07/2021 12:49  Hemoglobin A1C Latest Ref Range: 4.6 - 6.5 % 5.9   07/01/21 5.8   Former PCP Dr. Marguerita Beards in [place] try to get records    Specialists  GIST tumor 2/16/21KIT exon 13 detected + [facility] Dr. Mont D[facility]  H/o Dr. Dorathy Daft eye referred  [facility]   Provider: Dr. Olivia Mackie McLean-Scocuzza-Internal Medicine

## 2021-09-24 NOTE — Patient Instructions (Addendum)
Hold amlodipine if BP approaching 100/ (top) 60 (bottom) Rogaine for women  Hair skin and nail vitamin  Vital protein collagen  Rosemary oil -[employer] brand or add drops of rosemary 5 drops to shampoo  Consider new pfizer covid vaccine in 2 weeks   Dermatology will call you if not call then Dr. Ralph Dowdy   [phone] Not available [address]   [place] [address]      Specialties

## 2021-09-26 LAB — URINE CULTURE
MICRO NUMBER:: 12675667
SPECIMEN QUALITY:: ADEQUATE

## 2021-09-28 LAB — CELIAC DISEASE ANTIBODY SCREEN
Antigliadin Abs, IgA: 4 units (ref 0–19)
IgA/Immunoglobulin A, Serum: 295 mg/dL (ref 64–422)
Transglutaminase IgA: <2 U/mL (ref 0–3)

## 2021-10-03 MED ORDER — FLUOCINONIDE 0.05 % EX SOLN: 1.0000 "application " | Freq: Two times a day (BID) | CUTANEOUS | 2 refills | Status: DC

## 2021-10-03 NOTE — Addendum Note (Signed)
Addended by: Orland Mustard on: 10/03/2021 06:14 PM   Modules accepted: Orders

## 2021-10-06 ENCOUNTER — Telehealth: Payer: Self-pay | Admitting: Internal Medicine

## 2021-10-06 NOTE — Telephone Encounter (Signed)
Fax copy of [place] complete abdomen from [facility] hospital to Dr. Wells Guiles [place] [facility] need response about MRCP?   Then scan copy of [place] to chart   Thank you

## 2021-10-08 NOTE — Telephone Encounter (Signed)
Sent to scan and faxed

## 2021-10-13 ENCOUNTER — Telehealth: Payer: Self-pay | Admitting: Internal Medicine

## 2021-10-13 NOTE — Telephone Encounter (Signed)
She is only established with [facility] I wanted them to review her [place] from her home but they will not w/o and appt I faxed them her [place]  report  She had enlarged common bile duct  Does she want to see [facility] or here?

## 2021-10-14 ENCOUNTER — Encounter: Payer: Self-pay | Admitting: Internal Medicine

## 2021-10-15 NOTE — Addendum Note (Signed)
Addended by: Orland Mustard on: 10/15/2021 02:39 PM   Modules accepted: Orders

## 2021-10-15 NOTE — Telephone Encounter (Signed)
Spoke with Patient's daughter and they are agreeable to referral in the [place] area

## 2021-12-25 ENCOUNTER — Ambulatory Visit: Payer: Medicare Other

## 2022-01-30 ENCOUNTER — Encounter: Payer: Self-pay | Admitting: Internal Medicine

## 2022-01-30 ENCOUNTER — Telehealth (INDEPENDENT_AMBULATORY_CARE_PROVIDER_SITE_OTHER): Payer: Medicare Other | Admitting: Internal Medicine

## 2022-01-30 ENCOUNTER — Other Ambulatory Visit: Payer: Self-pay

## 2022-01-30 VITALS — BP 127/77 | Ht 62.0 in | Wt 122.2 lb

## 2022-01-30 DIAGNOSIS — J309 Allergic rhinitis, unspecified: Secondary | ICD-10-CM | POA: Diagnosis not present

## 2022-01-30 DIAGNOSIS — G8929 Other chronic pain: Secondary | ICD-10-CM

## 2022-01-30 DIAGNOSIS — R051 Acute cough: Secondary | ICD-10-CM | POA: Diagnosis not present

## 2022-01-30 DIAGNOSIS — J321 Chronic frontal sinusitis: Secondary | ICD-10-CM

## 2022-01-30 DIAGNOSIS — H04203 Unspecified epiphora, bilateral lacrimal glands: Secondary | ICD-10-CM

## 2022-01-30 DIAGNOSIS — M545 Low back pain, unspecified: Secondary | ICD-10-CM

## 2022-01-30 MED ORDER — PATADAY 0.7 % OP SOLN: 1.0000 [drp] | Freq: Every day | OPHTHALMIC | 11 refills | Status: AC | PRN

## 2022-01-30 MED ORDER — AZITHROMYCIN 250 MG PO TABS: ORAL_TABLET | ORAL | 0 refills | Status: AC

## 2022-01-30 MED ORDER — HYDROCODONE BIT-HOMATROP MBR 5-1.5 MG/5ML PO SOLN: 5.0000 mL | Freq: Every evening | ORAL | 0 refills | Status: DC | PRN

## 2022-01-30 MED ORDER — CETIRIZINE HCL 10 MG PO TABS: 10.0000 mg | ORAL_TABLET | Freq: Every evening | ORAL | 3 refills | Status: AC | PRN

## 2022-01-30 NOTE — Progress Notes (Signed)
Virtual Visit via Video Note ? ?I connected with [name]  ? on 01/30/22 at  8:20 AM EDT by a video enabled telemedicine application and verified that I am speaking with the correct person using two identifiers. ? Location patient: [place] ?Location provider:work or home office ?Persons participating in the virtual visit: patient, provider ? ?I discussed the limitations and requested verbal permission for telemedicine visit. The patient expressed understanding and agreed to proceed. ? ? ?HPI: ? ?Acute telemedicine visit for : ?Cough productive with yellow phelgm, allergy sxs and eye irritation and swelling since Saturday tried otc robitussin sx less but still there eyes have been swollen and water thinkgs allergies but these symptons new cough started out like dog barking but now productive and daughter also c/w TB since mom travels to [place] they will take home covid test  ?Chronic low back pain given # Dr. Sharlet Salina to think about injections  ? ?-Pertinent past medical history: see below ?-Pertinent medication allergies: ?Allergies  ?Allergen Reactions  ? Acetaminophen Other (See Comments)  ?  Cold sweats, general malaise  ? Hctz [Hydrochlorothiazide]   ?  Hyponatremia ?  ? Tylenol With Codeine #3  [Acetaminophen-Codeine]   ?  Other reaction(s): Unknown  ? ?-COVID-19 vaccine status:  ?Immunization History  ?Administered Date(s) Administered  ? Fluad Quad(high Dose 65+) 07/13/2019, 09/13/2020, 09/24/2021  ? Influenza-Unspecified 09/02/2019  ? PFIZER Comirnaty(Gray Top)Covid-19 Tri-Sucrose Vaccine 03/13/2020, 04/03/2020  ? PFIZER(Purple Top)SARS-COV-2 Vaccination 03/13/2020, 11/01/2020  ? Pneumococcal Conjugate-13 04/29/2018  ? Pneumococcal Polysaccharide-23 04/30/2019  ? ? ? ?ROS: See pertinent positives and negatives per HPI. ? ?Past Medical History:  ?Diagnosis Date  ? Arthritis   ? left shoulder, right low back and right hip   ? Hyperlipidemia   ? Hypertension   ? UTI (urinary tract infection)   ? ? ?Past Surgical  History:  ?Procedure Laterality Date  ? CATARACT EXTRACTION    ? b/l   ? 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 ? ? ?Current Outpatient Medications:  ?  amLODipine (NORVASC) 2.5 MG tablet, Take 1 tablet (2.5 mg total) by mouth daily. If BP>130/>80, Disp: 90 tablet, Rfl: 3 ?  Ascorbic Acid (VITAMIN C PO), Take 1,000 mg by mouth daily., Disp: , Rfl:  ?  aspirin EC 81 MG tablet, Take 81 mg by mouth daily., Disp: , Rfl:  ?  azelastine (ASTELIN) 0.1 % nasal spray, Place 2 sprays into both nostrils 2 (two) times daily. Use in each nostril as directed, Disp: , Rfl:  ?  azithromycin (ZITHROMAX) 250 MG tablet, Take 2 tablets on day 1, then 1 tablet daily on days 2 through 5, Disp: 6 tablet, Rfl: 0 ?  B Complex Vitamins (VITAMIN B COMPLEX PO), Take by mouth., Disp: , Rfl:  ?  cetirizine (ZYRTEC) 10 MG tablet, Take 1 tablet (10 mg total) by mouth at bedtime as needed for allergies., Disp: 90 tablet, Rfl: 3 ?  Cholecalciferol (VITAMIN D3) 50 MCG (2000 UT) capsule, Take 5,000 Units by mouth daily., Disp: , Rfl:  ?  fluocinonide (LIDEX) 0.05 % external solution, Apply 1 application topically 2 (two) times daily. Scalp x 2-4 weeks hair loss/itchy scalp insurance did not cover clobetasol, Disp: 60 mL, Rfl: 2 ?  HYDROcodone bit-homatropine (HYCODAN) 5-1.5 MG/5ML syrup, Take 5 mLs by mouth at bedtime as needed for cough., Disp: 120 mL, Rfl: 0 ?  lisinopril (ZESTRIL) 40 MG tablet, Take 1  tablet (40 mg total) by mouth daily. D/c lis 20-hctz 25, Disp: 90 tablet, Rfl: 3 ?  metoprolol succinate (TOPROL-XL) 25 MG 24 hr tablet, Take 1 tablet (25 mg total) by mouth daily., Disp: 90 tablet, Rfl: 3 ?  multivitamin-lutein (OCUVITE-LUTEIN) CAPS capsule, Take 1 capsule by mouth daily., Disp: , Rfl:  ?  Olopatadine HCl (PATADAY) 0.7 % SOLN, Apply 1 drop to eye daily as needed., Disp: 2.5 mL, Rfl: 11 ?  Omega-3 Fatty Acids (OMEGA 3 PO), Take 600 mg by mouth daily., Disp: , Rfl:  ?   Polyethyl Glycol-Propyl Glycol (SYSTANE OP), Apply to eye., Disp: , Rfl:  ?  simvastatin (ZOCOR) 20 MG tablet, TAKE 1 TABLET BY MOUTH ONCE DAILY AT  6PM, Disp: 90 tablet, Rfl: 3 ?  Suvorexant (BELSOMRA) 5 MG TABS, Take 5 mg by mouth at bedtime as needed., Disp: 30 tablet, Rfl: 0 ?  dicyclomine (BENTYL) 20 MG tablet, Take 1 tablet (20 mg total) by mouth 4 (four) times daily -  before meals and at bedtime. (Patient not taking: Reported on 01/30/2022), Disp: 30 tablet, Rfl: 0 ? ?EXAM: ? ?VITALS per patient if applicable: ? ?GENERAL: alert, oriented, appears well and in no acute distress ? ?HEENT: atraumatic, conjunttiva clear, no obvious abnormalities on inspection of external nose and ears ? ?NECK: normal movements of the head and neck ? ?LUNGS: on inspection no signs of respiratory distress, breathing rate appears normal, no obvious gross SOB, gasping or wheezing ? ?CV: no obvious cyanosis ? ?MS: moves all visible extremities without noticeable abnormality ? ?PSYCH/NEURO: pleasant and cooperative, no obvious depression or anxiety, speech and thought processing grossly intact ? ?ASSESSMENT AND PLAN: ? ?Discussed the following assessment and plan: ? ?Frontal sinusitis, unspecified chronicity - Plan: azithromycin (ZITHROMAX) 250 MG tablet, cetirizine (ZYRTEC) 10 MG tablet ? ?Allergic rhinitis, unspecified seasonality, unspecified trigger - Plan: cetirizine (ZYRTEC) 10 MG tablet ? ?Acute cough - Plan: HYDROcodone bit-homatropine (HYCODAN) 5-1.5 MG/5ML syrup, DG Chest 2 View will sch weds/Thursday after lunch ?Prn robitussin  ? ?Watery eyes - Plan: Olopatadine HCl (PATADAY) 0.7 % SOLN ? ?Chronic low back pain ?Given # Dr Sharlet Salina to consider injection  ?Consider pt +/- chronic pain meds  ? ?HM  ?See prior ? ?-we discussed possible serious and likely etiologies, options for evaluation and workup, limitations of telemedicine visit vs in person visit, treatment, treatment risks and precautions. Pt is agreeable to treatment via  telemedicine at this moment.  ? ?I discussed the assessment and treatment plan with the patient. The patient was provided an opportunity to ask questions and all were answered. The patient agreed with the plan and demonstrated an understanding of the instructions. ?  ? ?Time spent 20 min ?Nino Glow McLean-Scocuzza, MD   ?

## 2022-02-10 ENCOUNTER — Other Ambulatory Visit: Payer: Medicare Other

## 2022-02-13 ENCOUNTER — Telehealth: Payer: Self-pay

## 2022-02-13 ENCOUNTER — Ambulatory Visit (INDEPENDENT_AMBULATORY_CARE_PROVIDER_SITE_OTHER): Payer: Medicare Other

## 2022-02-13 VITALS — BP 134/82 | HR 68 | Ht 62.0 in | Wt 122.0 lb

## 2022-02-13 DIAGNOSIS — Z0000 Encounter for general adult medical examination without abnormal findings: Secondary | ICD-10-CM | POA: Diagnosis not present

## 2022-02-13 NOTE — Progress Notes (Signed)
Subjective:   Chelsey Lynch is a 76 y.o. female who presents for Medicare Annual (Subsequent) preventive examination.  Review of Systems    No ROS.  Medicare Wellness Virtual Visit.  Visual/audio telehealth visit, UTA vital signs.   See social history for additional risk factors.   Cardiac Risk Factors include: advanced age (>90men, >77 women);hypertension     Objective:    Today's Vitals   02/13/22 0828  BP: 134/82  Pulse: 68  Weight: 122 lb (55.3 kg)  Height: 5\' 2"  (1.575 m)   Body mass index is 22.31 kg/m.     02/13/2022    8:49 AM 02/24/2021    9:57 AM 12/24/2020    3:57 PM 08/23/2020    2:22 PM 04/12/2020    2:06 PM 08/18/2019    8:16 AM 08/18/2019    7:48 AM  Advanced Directives  Does Patient Have a Medical Advance Directive? No No No No No No No  Would patient like information on creating a medical advance directive?   No - Patient declined   No - Patient declined     Current Medications (verified) Outpatient Encounter Medications as of 02/13/2022  Medication Sig   amLODipine (NORVASC) 2.5 MG tablet Take 1 tablet (2.5 mg total) by mouth daily. If BP>130/>80   Ascorbic Acid (VITAMIN C PO) Take 1,000 mg by mouth daily.   aspirin EC 81 MG tablet Take 81 mg by mouth daily.   azelastine (ASTELIN) 0.1 % nasal spray Place 2 sprays into both nostrils 2 (two) times daily. Use in each nostril as directed   B Complex Vitamins (VITAMIN B COMPLEX PO) Take by mouth.   cetirizine (ZYRTEC) 10 MG tablet Take 1 tablet (10 mg total) by mouth at bedtime as needed for allergies.   Cholecalciferol (VITAMIN D3) 50 MCG (2000 UT) capsule Take 5,000 Units by mouth daily.   dicyclomine (BENTYL) 20 MG tablet Take 1 tablet (20 mg total) by mouth 4 (four) times daily -  before meals and at bedtime. (Patient not taking: Reported on 01/30/2022)   fluocinonide (LIDEX) 0.05 % external solution Apply 1 application topically 2 (two) times daily. Scalp x 2-4 weeks hair loss/itchy scalp insurance did not  cover clobetasol   HYDROcodone bit-homatropine (HYCODAN) 5-1.5 MG/5ML syrup Take 5 mLs by mouth at bedtime as needed for cough.   lisinopril (ZESTRIL) 40 MG tablet Take 1 tablet (40 mg total) by mouth daily. D/c lis 20-hctz 25   metoprolol succinate (TOPROL-XL) 25 MG 24 hr tablet Take 1 tablet (25 mg total) by mouth daily.   multivitamin-lutein (OCUVITE-LUTEIN) CAPS capsule Take 1 capsule by mouth daily.   Olopatadine HCl (PATADAY) 0.7 % SOLN Apply 1 drop to eye daily as needed.   Omega-3 Fatty Acids (OMEGA 3 PO) Take 600 mg by mouth daily.   Polyethyl Glycol-Propyl Glycol (SYSTANE OP) Apply to eye.   simvastatin (ZOCOR) 20 MG tablet TAKE 1 TABLET BY MOUTH ONCE DAILY AT  6PM   Suvorexant (BELSOMRA) 5 MG TABS Take 5 mg by mouth at bedtime as needed.   No facility-administered encounter medications on file as of 02/13/2022.    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 +   Family History  Problem Relation Age of Onset   Breast cancer Other    Social History   Socioeconomic History   Marital status: Divorced    Spouse name: Not on file   Number of children: Not on file   Years of education: Not on file   Highest education level: Not on file  Occupational History   Not on file  Tobacco Use   Smoking status: Never   Smokeless tobacco: Never  Substance and Sexual Activity   Alcohol use: Not Currently   Drug use: Not Currently   Sexual activity: Not Currently  Other Topics Concern   Not on file  Social History Narrative   4 daughters    [place]  here from [place], [place].    Currently not working and pending insurance as of 06/30/19    From the [employer]    Social Determinants of Health   Financial Resource Strain: Low Risk    Difficulty of Paying Living Expenses: Not hard at all  Food Insecurity: No Food Insecurity   Worried About Programme researcher, broadcasting/film/video in the Last Year: Never true   Barista in the Last Year: Never true  Transportation Needs: No Transportation Needs   Lack of Transportation (Medical): No   Lack of Transportation (Non-Medical): No  Physical Activity: Sufficiently Active   Days of Exercise per Week: 7 days   Minutes of Exercise per Session: 30 min  Stress: No Stress Concern Present   Feeling of Stress : Only a little  Social Connections: Moderately Integrated   Frequency of Communication with Friends and Family: Not on file   Frequency of Social Gatherings with Friends and Family: More than three times a week   Attends Religious Services: 1 to 4 times per year   Active Member of [employer] or Organizations: Yes   Attends Banker Meetings: 1 to 4 times per year   Marital Status: Divorced    Tobacco Counseling Counseling given: Not Answered   Clinical Intake:  Pre-visit preparation completed: Yes        Diabetes: No  How often do you need to have someone help you when you read instructions, pamphlets, or other written materials from your doctor or pharmacy?: 1 - Never    Interpreter Needed?: No      Activities of Daily Living    02/13/2022    8:36 AM  In your present state of health, do you have any difficulty performing the following activities:  Hearing? 0  Vision? 0  Difficulty concentrating or making decisions? 0  Comment Age appropriate  Walking or climbing stairs? 1  Comment Chronic leg/knee pain/weakness  Dressing or bathing? 0  Doing errands, shopping? 1  Preparing Food and eating ? N  Using the Toilet? N  In the past six months, have you accidently leaked urine?  Y  Comment Wears depend brief  Do you have problems with loss of bowel control? N  Managing your Medications? Y  Comment Daughter assist  Managing your Finances? Y  Comment Daughter assist  Housekeeping or managing your Housekeeping? N    Patient Care Team: McLean-Scocuzza, Pasty Spillers, MD as PCP - General (Internal Medicine)  Indicate any recent Medical Services you may have received from other than [facility] providers in the past year (date may be  approximate).     Assessment:   This is a routine wellness examination for [place].  Virtual Visit via Telephone Note  I connected with  Lerry Paterson on 02/13/22 at  8:15 AM EDT by telephone and verified that I am speaking with the correct person using two identifiers.  Persons participating in the virtual visit: patient/Nurse Health Advisor   I discussed the limitations of performing an evaluation and management service by telephone and the availability of in person appointments. The patient expressed understanding and agreed to proceed. We continued and completed visit with audio only. Some vital signs may be absent or patient reported.   Hearing/Vision screen Hearing Screening - Comments:: Patient is able to hear conversational tones without difficulty. No issues reported. Vision Screening - Comments:: Wears corrective lenses  They have seen their ophthalmologist in the last 12 months.   Dietary issues and exercise activities discussed: Current Exercise Habits: Home exercise routine, Type of exercise: walking;stretching, Intensity: Mild Healthy diet Good water intake   Goals Addressed             This Visit's Progress    Maintain Healthy Lifestyle       Stay active, as tolerated Healthy diet Stay hydrated       Depression Screen    02/13/2022    8:31 AM 01/30/2022    8:08 AM 12/24/2020    3:49 PM 03/12/2020    2:08 PM  PHQ 2/9 Scores  PHQ - 2 Score 0 0 1 0    Fall Risk    02/13/2022    8:36 AM 03/21/2021    3:45 PM  12/24/2020    3:58 PM 11/15/2020    9:01 AM 09/13/2020    9:03 AM  Fall Risk   Falls in the past year? 0 0 0 0 0  Number falls in past yr: 0 0 0 0 0  Injury with Fall?  0 0 0 0  Follow up Falls evaluation completed Falls evaluation completed Falls evaluation completed Falls evaluation completed Falls evaluation completed    FALL RISK PREVENTION PERTAINING TO THE HOME: Home free of loose throw rugs in walkways, pet beds, electrical cords, etc? Yes  Adequate lighting in your home to reduce risk of falls? Yes   ASSISTIVE DEVICES UTILIZED TO PREVENT FALLS: Life alert? No  Use of a cane, walker or w/c? No  Grab bars in the bathroom? No  Shower chair or bench in shower? Yes  Elevated toilet seat or a handicapped toilet? No   TIMED UP AND GO: Was the test performed? No .   Cognitive Function:  Patient is alert and oriented x3.       12/24/2020    4:15 PM  6CIT Screen  What Year? 0 points  What month? 0 points  What time? 0 points    Immunizations Immunization History  Administered Date(s) Administered   Fluad Quad(high Dose 65+) 07/13/2019, 09/13/2020, 09/24/2021   Influenza-Unspecified 09/02/2019   PFIZER Comirnaty(Gray Top)Covid-19 Tri-Sucrose Vaccine 03/13/2020, 04/03/2020   PFIZER(Purple Top)SARS-COV-2 Vaccination 03/13/2020, 11/01/2020   Pneumococcal Conjugate-13 04/29/2018   Pneumococcal Polysaccharide-23 04/30/2019   TDAP status: Due, Education has been provided regarding the importance of this vaccine. Advised may receive this vaccine at local pharmacy or Health Dept. Aware to provide a copy of the vaccination record if obtained from local pharmacy or Health Dept. Verbalized acceptance and understanding. Deferred.   Shingrix Completed?: No.    Education has been provided regarding the importance of this vaccine. Patient has  been advised to call insurance company to determine out of pocket expense if they have not yet received this vaccine. Advised may also receive  vaccine at local pharmacy or Health Dept. Verbalized acceptance and understanding.  Screening Tests Health Maintenance  Topic Date Due   DEXA SCAN  Never done   Zoster Vaccines- Shingrix (1 of 2) 05/15/2022 (Originally 09/30/1965)   TETANUS/TDAP  02/14/2023 (Originally 09/30/1965)   COVID-19 Vaccine (5 - Booster) 02/17/2023 (Originally 12/27/2020)   INFLUENZA VACCINE  06/02/2022   Pneumonia Vaccine 91+ Years old  Completed   Hepatitis C Screening  Completed   HPV VACCINES  Aged Out   COLONOSCOPY (Pts 45-42yrs Insurance coverage will need to be confirmed)  Discontinued   Health Maintenance Health Maintenance Due  Topic Date Due   DEXA SCAN  Never done   Mammogram- discontinued.  Lung Cancer Screening: (Low Dose CT Chest recommended if Age 25-80 years, 30 pack-year currently smoking OR have quit w/in 15years.) does not qualify.   Vision Screening: Recommended annual ophthalmology exams for early detection of glaucoma and other disorders of the eye.  Dental Screening: Recommended annual dental exams for proper oral hygiene  Community Resource Referral / Chronic Care Management: CRR required this visit?  No   CCM required this visit?  No      Plan:     I have personally reviewed and noted the following in the patient's chart:   Medical and social history Use of alcohol, tobacco or illicit drugs  Current medications and supplements including opioid prescriptions.  Functional ability and status Nutritional status Physical activity Advanced directives List of other physicians Hospitalizations, surgeries, and ER visits in previous 12 months Vitals Screenings to include cognitive, depression, and falls Referrals and appointments  In addition, I have reviewed and discussed with patient certain preventive protocols, quality metrics, and best practice recommendations. A written personalized care plan for preventive services as well as general preventive health recommendations  were provided to patient.     Ashok Pall, LPN   1/61/0960

## 2022-02-13 NOTE — Patient Instructions (Addendum)
?  Ms. Morell , ?Thank you for taking time to come for your Medicare Wellness Visit. I appreciate your ongoing commitment to your health goals. Please review the following plan we discussed and let me know if I can assist you in the future.  ? ?These are the goals we discussed: ? Goals   ? ?  Maintain Healthy Lifestyle   ?  Stay active, as tolerated ?Healthy diet ?Stay hydrated ?  ? ?  ?  ?This is a list of the screening recommended for you and due dates:  ?Health Maintenance  ?Topic Date Due  ? DEXA scan (bone density measurement)  Never done  ? Zoster (Shingles) Vaccine (1 of 2) 05/15/2022*  ? Tetanus Vaccine  02/14/2023*  ? COVID-19 Vaccine (5 - Booster) 02/17/2023*  ? Flu Shot  06/02/2022  ? Pneumonia Vaccine  Completed  ? Hepatitis C Screening: USPSTF Recommendation to screen - Ages 85-79 yo.  Completed  ? HPV Vaccine  Aged Out  ? Colon Cancer Screening  Discontinued  ?*Topic was postponed. The date shown is not the original due date.  ?  ?

## 2022-02-13 NOTE — Telephone Encounter (Signed)
No answer when called for scheduled AWV. Technical difficulties with epic system at facility and call did not go through until 8:20. Unable to leave voice message, mailbox full. Reschedule as appropriate.  ?

## 2022-02-16 ENCOUNTER — Ambulatory Visit
Admission: RE | Admit: 2022-02-16 | Discharge: 2022-02-16 | Disposition: A | Discharge status: Home / Self Care | Payer: Medicare Other | Source: Ambulatory Visit | Attending: Oncology | Admitting: Oncology

## 2022-02-16 ENCOUNTER — Other Ambulatory Visit: Payer: Self-pay

## 2022-02-16 DIAGNOSIS — C49A0 Gastrointestinal stromal tumor, unspecified site: Secondary | ICD-10-CM

## 2022-02-16 LAB — POCT I-STAT CREATININE: Creatinine, Ser: 0.8 mg/dL (ref 0.44–1.00)

## 2022-02-16 MED ORDER — IOHEXOL 300 MG/ML  SOLN
75.0000 mL | Freq: Once | INTRAMUSCULAR | Status: AC | PRN
  Administered 2022-02-16: 75 mL via INTRAVENOUS

## 2022-02-17 ENCOUNTER — Inpatient Hospital Stay: Payer: Medicare Other | Attending: Oncology

## 2022-02-17 DIAGNOSIS — Z7982 Long term (current) use of aspirin: Secondary | ICD-10-CM | POA: Diagnosis not present

## 2022-02-17 DIAGNOSIS — Z803 Family history of malignant neoplasm of breast: Secondary | ICD-10-CM | POA: Insufficient documentation

## 2022-02-17 DIAGNOSIS — Z79899 Other long term (current) drug therapy: Secondary | ICD-10-CM | POA: Diagnosis not present

## 2022-02-17 DIAGNOSIS — C49A2 Gastrointestinal stromal tumor of stomach: Secondary | ICD-10-CM | POA: Diagnosis not present

## 2022-02-17 DIAGNOSIS — R1031 Right lower quadrant pain: Secondary | ICD-10-CM | POA: Insufficient documentation

## 2022-02-17 DIAGNOSIS — C49A0 Gastrointestinal stromal tumor, unspecified site: Secondary | ICD-10-CM

## 2022-02-17 LAB — COMPREHENSIVE METABOLIC PANEL
ALT: 23 U/L (ref 0–44)
AST: 27 U/L (ref 15–41)
Albumin: 4.1 g/dL (ref 3.5–5.0)
Alkaline Phosphatase: 59 U/L (ref 38–126)
Anion gap: 5 (ref 5–15)
BUN: 11 mg/dL (ref 8–23)
CO2: 30 mmol/L (ref 22–32)
Calcium: 9 mg/dL (ref 8.9–10.3)
Chloride: 99 mmol/L (ref 98–111)
Creatinine, Ser: 0.82 mg/dL (ref 0.44–1.00)
GFR, Estimated: >60 mL/min (ref >60)
Glucose, Bld: 109 mg/dL — ABNORMAL HIGH (ref 70–99)
Potassium: 4 mmol/L (ref 3.5–5.1)
Sodium: 134 mmol/L — ABNORMAL LOW (ref 135–145)
Total Bilirubin: 0.7 mg/dL (ref 0.3–1.2)
Total Protein: 7.6 g/dL (ref 6.5–8.1)

## 2022-02-17 LAB — CBC WITH DIFFERENTIAL/PLATELET
Abs Immature Granulocytes: 0.01 10*3/uL (ref 0.00–0.07)
Basophils Absolute: 0.1 10*3/uL (ref 0.0–0.1)
Basophils Relative: 1 %
Eosinophils Absolute: 0.2 10*3/uL (ref 0.0–0.5)
Eosinophils Relative: 3 %
HCT: 40 % (ref 36.0–46.0)
Hemoglobin: 12.9 g/dL (ref 12.0–15.0)
Immature Granulocytes: 0 %
Lymphocytes Relative: 18 %
Lymphs Abs: 1.1 10*3/uL (ref 0.7–4.0)
MCH: 29.9 pg (ref 26.0–34.0)
MCHC: 32.3 g/dL (ref 30.0–36.0)
MCV: 92.6 fL (ref 80.0–100.0)
Monocytes Absolute: 0.4 10*3/uL (ref 0.1–1.0)
Monocytes Relative: 6 %
Neutro Abs: 4.7 10*3/uL (ref 1.7–7.7)
Neutrophils Relative %: 72 %
Platelets: 214 10*3/uL (ref 150–400)
RBC: 4.32 MIL/uL (ref 3.87–5.11)
RDW: 12.4 % (ref 11.5–15.5)
WBC: 6.4 10*3/uL (ref 4.0–10.5)
nRBC: 0 % (ref 0.0–0.2)

## 2022-02-18 ENCOUNTER — Ambulatory Visit (INDEPENDENT_AMBULATORY_CARE_PROVIDER_SITE_OTHER): Payer: Medicare Other

## 2022-02-18 ENCOUNTER — Other Ambulatory Visit: Payer: Medicare Other

## 2022-02-18 DIAGNOSIS — R051 Acute cough: Secondary | ICD-10-CM

## 2022-02-20 ENCOUNTER — Telehealth: Payer: Self-pay

## 2022-02-20 NOTE — Telephone Encounter (Signed)
Called pt using interpreter services to inform pt about normal chest x-ray results. Unable to reach nor lvm. ?

## 2022-02-23 ENCOUNTER — Encounter: Payer: Self-pay | Admitting: Oncology

## 2022-02-23 ENCOUNTER — Inpatient Hospital Stay (HOSPITAL_BASED_OUTPATIENT_CLINIC_OR_DEPARTMENT_OTHER): Payer: Medicare Other | Admitting: Oncology

## 2022-02-23 VITALS — BP 142/81 | HR 57 | Temp 97.4°F | Wt 119.0 lb

## 2022-02-23 DIAGNOSIS — R109 Unspecified abdominal pain: Secondary | ICD-10-CM

## 2022-02-23 DIAGNOSIS — Z789 Other specified health status: Secondary | ICD-10-CM | POA: Diagnosis not present

## 2022-02-23 DIAGNOSIS — C49A0 Gastrointestinal stromal tumor, unspecified site: Secondary | ICD-10-CM | POA: Diagnosis not present

## 2022-02-23 DIAGNOSIS — C49A2 Gastrointestinal stromal tumor of stomach: Secondary | ICD-10-CM | POA: Diagnosis not present

## 2022-02-23 MED ORDER — DICYCLOMINE HCL 20 MG PO TABS: 20.0000 mg | ORAL_TABLET | Freq: Two times a day (BID) | ORAL | 5 refills | Status: AC | PRN

## 2022-02-23 NOTE — Progress Notes (Signed)
?Hematology/Oncology follow up note ?[facility] ?[phone] [phone] ? ? ?Patient Care Team: ?McLean-Scocuzza, Nino Glow, MD as PCP - General (Internal Medicine) ? ?REFERRING PROVIDER: ?McLean-Scocuzza, Olivia Mackie *  ?CHIEF COMPLAINTS/REASON FOR VISIT:  ?Follow up for GIST ? ?HISTORY OF PRESENTING ILLNESS:  ? ?Chelsey Lynch is a  76 y.o.  female with PMH listed below was seen in consultation at the request of  McLean-Scocuzza, Olivia Mackie *  for evaluation of GIST ? ?08/18/2019, CT abdomen pelvis was performed for intermittent lower abdomen and back pain with incidental findings of 2.6 cm mass in the lesser curvature of the stomach.  Just was favored.  Patient was referred to [facility] Dr. Donnal Moat who recommended EUS with biopsy. ?11/24/2019, patient underwent EUS biopsy. ?Pathology showed gastrointestinal stromal tumor, spindle cell type ?Patient also underwent colonoscopy on 11/30/2019.  Status post 1 colon polyp status resection.  Pathology showed tubular adenoma.  Left colon endoscopic biopsy showed chronic mucosa with ischemic pattern of ?12/19/2019 patient underwent wedge gastrectomy ?Pathology showed GIST, spindle cell type, 3.2 cm, histology grade 2, high-grade, mitotic rate 11/83m2, ?Unifocal, margins were negative.  No lymph nodes were submitted.  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 ? ?Patient prefers to establish care locally for discussion of adjuvant treatment. ?Patient lost weight after surgery, appetite has improved.  ? also has experienced postsurgical abdominal discomfort at the lower abdomen which is improving.  Patient wears abdominal binder routinely.  Currently she is not taking oxycodone. ?Patient walks independently at home.  Lives with daughter in [place] ?Patient declined adjuvant Gleevec. ? ?INTERVAL HISTORY ?Chelsey Gaugeris a 76y.o. female who has above history reviewed by me today presents for follow up visit for management of GIST ?Patient  complains of intermittent right lower quadrant pain which spontaneously improves ?She is here by herself. ?Patient currently lives with her daughter.  She tells me that her daughter is going to move and she does not want to relocate.  She does not have income and is quite concerned.  She appears to be stressed. ? ?Review of Systems  ?Constitutional:  Negative for fatigue.  ?Eyes:  Negative for icterus.  ?Respiratory:  Negative for cough and shortness of breath.   ?Cardiovascular:  Negative for chest pain.  ?Gastrointestinal:  Negative for abdominal distention.  ?Genitourinary:  Negative for dysuria.   ?Musculoskeletal:  Negative for arthralgias.  ?Skin:  Negative for rash.  ?Neurological:  Negative for headaches.  ?Hematological:  Negative for adenopathy.  ?Psychiatric/Behavioral:  Negative for confusion.   ? ?MEDICAL HISTORY:  ?Past Medical History:  ?Diagnosis Date  ? Arthritis   ? left shoulder, right low back and right hip   ? Hyperlipidemia   ? Hypertension   ? UTI (urinary tract infection)   ? ? ?SURGICAL HISTORY: ?Past Surgical History:  ?Procedure Laterality Date  ? CATARACT EXTRACTION    ? b/l   ? 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 ? ?SOCIAL HISTORY: ?Social History  ? ?Socioeconomic History  ? Marital status: Divorced  ?  Spouse name: Not on file  ? Number of children: Not on file  ? Years of education: Not on file  ? Highest education level: Not on file  ?Occupational History  ? Not on file  ?Tobacco Use  ? Smoking status: Never  ? Smokeless tobacco: Never  ?Substance and Sexual Activity  ? Alcohol use: Not Currently  ? Drug  use: Not Currently  ? Sexual activity: Not Currently  ?Other Topics Concern  ? Not on file  ?Social History Narrative  ? 4 daughters   ? Moved here from [place], [place].   ? Currently not working and pending insurance as of 06/30/19   ? From the [place]   ? ?Social Determinants of Health  ? ?Financial Resource Strain:  Low Risk   ? Difficulty of Paying Living Expenses: Not hard at all  ?Food Insecurity: No Food Insecurity  ? Worried About Charity fundraiser in the Last Year: Never true  ? Ran Out of Food in the Last Year: Never true  ?Transportation Needs: No Transportation Needs  ? Lack of Transportation (Medical): No  ? Lack of Transportation (Non-Medical): No  ?Physical Activity: Sufficiently Active  ? Days of Exercise per Week: 7 days  ? Minutes of Exercise per Session: 30 min  ?Stress: No Stress Concern Present  ? Feeling of Stress : Only a little  ?Social Connections: Moderately Integrated  ? Frequency of Communication with Friends and Family: Not on file  ? Frequency of Social Gatherings with Friends and Family: More than three times a week  ? Attends Religious Services: 1 to 4 times per year  ? Active Member of Clubs or Organizations: Yes  ? Attends Archivist Meetings: 1 to 4 times per year  ? Marital Status: Divorced  ?Intimate Partner Violence: Not At Risk  ? Fear of Current or Ex-Partner: No  ? Emotionally Abused: No  ? Physically Abused: No  ? Sexually Abused: No  ? ? ?FAMILY HISTORY: ?Family History  ?Problem Relation Age of Onset  ? Breast cancer Other   ? ? ?ALLERGIES:  is allergic to acetaminophen, hctz [hydrochlorothiazide], and tylenol with codeine #3  [acetaminophen-codeine]. ? ?MEDICATIONS:  ?Current Outpatient Medications  ?Medication Sig Dispense Refill  ? amLODipine (NORVASC) 2.5 MG tablet Take 1 tablet (2.5 mg total) by mouth daily. If BP>130/>80 90 tablet 3  ? Ascorbic Acid (VITAMIN C PO) Take 1,000 mg by mouth daily.    ? aspirin EC 81 MG tablet Take 81 mg by mouth daily.    ? azelastine (ASTELIN) 0.1 % nasal spray Place 2 sprays into both nostrils 2 (two) times daily. Use in each nostril as directed    ? B Complex Vitamins (VITAMIN B COMPLEX PO) Take by mouth.    ? cetirizine (ZYRTEC) 10 MG tablet Take 1 tablet (10 mg total) by mouth at bedtime as needed for allergies. 90 tablet 3  ?  Cholecalciferol (VITAMIN D3) 50 MCG (2000 UT) capsule Take 5,000 Units by mouth daily.    ? fluocinonide (LIDEX) 0.05 % external solution Apply 1 application topically 2 (two) times daily. Scalp x 2-4 weeks hair loss/itchy scalp insurance did not cover clobetasol 60 mL 2  ? HYDROcodone bit-homatropine (HYCODAN) 5-1.5 MG/5ML syrup Take 5 mLs by mouth at bedtime as needed for cough. 120 mL 0  ? lisinopril (ZESTRIL) 40 MG tablet Take 1 tablet (40 mg total) by mouth daily. D/c lis 20-hctz 25 90 tablet 3  ? metoprolol succinate (TOPROL-XL) 25 MG 24 hr tablet Take 1 tablet (25 mg total) by mouth daily. 90 tablet 3  ? multivitamin-lutein (OCUVITE-LUTEIN) CAPS capsule Take 1 capsule by mouth daily.    ? Olopatadine HCl (PATADAY) 0.7 % SOLN Apply 1 drop to eye daily as needed. 2.5 mL 11  ? Omega-3 Fatty Acids (OMEGA 3 PO) Take 600 mg by mouth daily.    ?  Polyethyl Glycol-Propyl Glycol (SYSTANE OP) Apply to eye.    ? simvastatin (ZOCOR) 20 MG tablet TAKE 1 TABLET BY MOUTH ONCE DAILY AT  6PM 90 tablet 3  ? Suvorexant (BELSOMRA) 5 MG TABS Take 5 mg by mouth at bedtime as needed. 30 tablet 0  ? dicyclomine (BENTYL) 20 MG tablet Take 1 tablet (20 mg total) by mouth every 12 (twelve) hours as needed for spasms. 60 tablet 5  ? ?No current facility-administered medications for this visit.  ? ? ? ?PHYSICAL EXAMINATION: ?ECOG PERFORMANCE STATUS: 0 - Asymptomatic ?Vitals:  ? 02/23/22 1020  ?BP: (!) 142/81  ?Pulse: (!) 57  ?Temp: (!) 97.4 ?F (36.3 ?C)  ? ?Filed Weights  ? 02/23/22 1020  ?Weight: 119 lb (54 kg)  ? ? ?Physical Exam ?Constitutional:   ?   General: She is not in acute distress. ?   Comments: Patient walks independently  ?HENT:  ?   Head: Normocephalic and atraumatic.  ?Eyes:  ?   General: No scleral icterus. ?Cardiovascular:  ?   Rate and Rhythm: Normal rate and regular rhythm.  ?   Heart sounds: Normal heart sounds.  ?Pulmonary:  ?   Effort: Pulmonary effort is normal. No respiratory distress.  ?   Breath sounds: No  wheezing.  ?Abdominal:  ?   General: Bowel sounds are normal. There is no distension.  ?   Palpations: Abdomen is soft.  ?Musculoskeletal:     ?   General: No deformity. Normal range of motion.  ?   Cervical back: N

## 2022-02-23 NOTE — Progress Notes (Signed)
Patient here for follow up. Patient complains of abdominal pain at times.  ?

## 2022-02-24 ENCOUNTER — Encounter: Payer: Self-pay | Admitting: Licensed Clinical Social Worker

## 2022-02-24 ENCOUNTER — Telehealth: Payer: Self-pay

## 2022-02-24 NOTE — Telephone Encounter (Signed)
Referral entered for SW due to family dynamics. Pt was here on 4/24 and stated that her daughter was moving and she was not going with her and she was concerned because she had no income and no insurance.  ? ?Chelsey Lynch, SW reached out to patient and per [place]: spoke with her daughter. The daughters husband is a Theme park manager and they are moving to a new congregation.  They are going to find out on Saturday where they are going to go, but it will be in [place], she's believes it'll be in [facility]. The patient will be moving with them, and she will be taken care of.  Patient does have Medicare A&B and Medicaid.  The patient's daughter will contact the cancer center once she knows where they are moving to, moving date and whether care will be transferred to [facility] or [facility]. ?

## 2022-02-24 NOTE — Progress Notes (Signed)
[facility] Clinical Social Work  ?Initial Assessment ? ? ?Chelsey Lynch is a 76 y.o. year old female contacted by phone. Clinical Social Work was referred by medical provider for assessment of psychosocial needs.  ? ?SDOH (Social Determinants of Health) assessments performed: No ?  ?Distress Screen completed: Yes ?   ? View : No data to display.  ?  ?  ?  ? ? ? ? ?Family/Social Information:  ?Housing Arrangement: patient lives with daughter and son-in-law . ?Family members/support persons in your life? Family and Medical Staff ?Transportation concerns: no  ?Employment: Retired. Income source: [place] ?Financial concerns: No ?Type of concern: None ?Food access concerns: no ?Religious or spiritual practice: yes ?Services Currently in place:  Medicare A&B, Medicaid ? ?Coping/ Adjustment to diagnosis: ?Patient understands treatment plan and what happens next? yes ?Concerns about diagnosis and/or treatment: I'm not especially worried about anything ?Patient reported stressors: Housing ?Hopes and priorities: Patient speak Tagalog and al of her visits are done via interpreter.  Patient was concerned about her daughter, whom she lives with moving, and stated she was not moving with her daughter.  CSW confirmed with patient's daughter Chelsey Lynch, patient will be moving with her to new home. ?Patient enjoys time with family/ friends ?Current coping skills/ strengths: Religious Affiliation  and Special hobby/interest  ? ? ? SUMMARY: ?Current SDOH Barriers:  ?Financial constraints related to fixed income and lack of understanding due to language barriers ? ?Clinical Social Work Clinical Goal(s):  ?Ms. Chelsey Lynch will speak with patient and reassure her she is moving with her to the new home. ? ?Interventions: ?Discussed common feeling and emotions when being diagnosed with cancer, and the importance of support during treatment ?Informed patient of the support team roles and support services at [facility] ?Provided CSW  contact information and encouraged patient to call with any questions or concerns ?Provided patient with information about role in patient care and available resources. ? ? ?Follow Up Plan: Patient will contact CSW with any support or resource needs ?Patient verbalizes understanding of plan: Yes ? ? ? ?Chelsey Mahurin, LCSW ?

## 2022-02-27 ENCOUNTER — Encounter: Payer: Self-pay | Admitting: Internal Medicine

## 2022-02-27 ENCOUNTER — Ambulatory Visit (INDEPENDENT_AMBULATORY_CARE_PROVIDER_SITE_OTHER): Payer: Medicare Other | Admitting: Internal Medicine

## 2022-02-27 VITALS — BP 130/82 | HR 64 | Temp 97.7°F | Resp 14 | Ht 62.0 in | Wt 117.8 lb

## 2022-02-27 DIAGNOSIS — H04203 Unspecified epiphora, bilateral lacrimal glands: Secondary | ICD-10-CM

## 2022-02-27 DIAGNOSIS — R002 Palpitations: Secondary | ICD-10-CM

## 2022-02-27 DIAGNOSIS — R5383 Other fatigue: Secondary | ICD-10-CM | POA: Diagnosis not present

## 2022-02-27 DIAGNOSIS — R202 Paresthesia of skin: Secondary | ICD-10-CM

## 2022-02-27 DIAGNOSIS — R200 Anesthesia of skin: Secondary | ICD-10-CM | POA: Diagnosis not present

## 2022-02-27 DIAGNOSIS — I10 Essential (primary) hypertension: Secondary | ICD-10-CM

## 2022-02-27 DIAGNOSIS — H53143 Visual discomfort, bilateral: Secondary | ICD-10-CM | POA: Diagnosis not present

## 2022-02-27 DIAGNOSIS — E538 Deficiency of other specified B group vitamins: Secondary | ICD-10-CM | POA: Diagnosis not present

## 2022-02-27 DIAGNOSIS — M5030 Other cervical disc degeneration, unspecified cervical region: Secondary | ICD-10-CM

## 2022-02-27 DIAGNOSIS — R7303 Prediabetes: Secondary | ICD-10-CM

## 2022-02-27 DIAGNOSIS — M51369 Other intervertebral disc degeneration, lumbar region without mention of lumbar back pain or lower extremity pain: Secondary | ICD-10-CM | POA: Insufficient documentation

## 2022-02-27 DIAGNOSIS — M5136 Other intervertebral disc degeneration, lumbar region: Secondary | ICD-10-CM

## 2022-02-27 LAB — TSH: TSH: 1.22 u[IU]/mL (ref 0.35–5.50)

## 2022-02-27 LAB — HEMOGLOBIN A1C: Hgb A1c MFr Bld: 6 % (ref 4.6–6.5)

## 2022-02-27 LAB — FOLATE: Folate: >23.5 ng/mL (ref >5.9)

## 2022-02-27 LAB — VITAMIN B12: Vitamin B-12: 617 pg/mL (ref 211–911)

## 2022-02-27 LAB — SEDIMENTATION RATE: Sed Rate: 31 mm/hr — ABNORMAL HIGH (ref 0–30)

## 2022-02-27 MED ORDER — AMLODIPINE BESYLATE 2.5 MG PO TABS: 2.5000 mg | ORAL_TABLET | Freq: Two times a day (BID) | ORAL | 3 refills | Status: AC

## 2022-02-27 MED ORDER — LOSARTAN POTASSIUM 50 MG PO TABS: 50.0000 mg | ORAL_TABLET | Freq: Every day | ORAL | 3 refills | Status: AC

## 2022-02-27 NOTE — Patient Instructions (Addendum)
[facility] ?4.7 ?[phone] [employer] reviews ?Ophthalmology clinic in [place], [place] ?Address: [address], [place], [place] [address] ?Hours:  ?Open ? Closes 12?PM ? Reopens 1?PM ?Phone: [phone] ? ?Change lisinopril 40 to losartan 50 mg daily due to dry cough which can be side effect of lisinopril  ? ?Consider heart monitor for heart racing with cardiology  ?If blood pressure is >130/>80 after medications you can take another dose amlodipine 2.5  ?If blood pressure <90/<60 hold amlodipine  ?Water 55-64 ounces  ? ? ?Consider neurology referral for nerve conduction/EMG upper arms and lower legs to see where numbness/tingling is coming from  ? ?Checked labs today  ? ?Carpal Tunnel Syndrome ? ?Carpal tunnel syndrome is a condition that causes pain, numbness, and weakness in your hand and fingers. The carpal tunnel is a narrow area located on the palm side of your wrist. Repeated wrist motion or certain diseases may cause swelling within the tunnel. This swelling pinches the main nerve in the wrist. The main nerve in the wrist is called the median nerve. ?What are the causes? ?This condition may be caused by: ?Repeated and forceful wrist and hand motions. ?Wrist injuries. ?Arthritis. ?A cyst or tumor in the carpal tunnel. ?Fluid buildup during pregnancy. ?Use of tools that vibrate. ?Sometimes the cause of this condition is not known. ?What increases the risk? ?The following factors may make you more likely to develop this condition: ?Having a job that requires you to repeatedly or forcefully move your wrist or hand or requires you to use tools that vibrate. This may include jobs that involve using computers, working on an [employer], or working with [place] such as Pension scheme manager. ?Being a woman. ?Having certain conditions, such as: ?Diabetes. ?Obesity. ?An underactive thyroid (hypothyroidism). ?Kidney failure. ?Rheumatoid arthritis. ?What are the signs or symptoms? ?Symptoms of this  condition include: ?A tingling feeling in your fingers, especially in your thumb, index, and middle fingers. ?Tingling or numbness in your hand. ?An aching feeling in your entire arm, especially when your wrist and elbow are bent for a long time. ?Wrist pain that goes up your arm to your shoulder. ?Pain that goes down into your palm or fingers. ?A weak feeling in your hands. You may have trouble grabbing and holding items. ?Your symptoms may feel worse during the night. ?How is this diagnosed? ?This condition is diagnosed with a medical history and physical exam. You may also have tests, including: ?Electromyogram (EMG). This test measures electrical signals sent by your nerves into the muscles. ?Nerve conduction study. This test measures how well electrical signals pass through your nerves. ?Imaging tests, such as X-rays, ultrasound, and MRI. These tests check for possible causes of your condition. ?How is this treated? ?This condition may be treated with: ?Lifestyle changes. It is important to stop or change the activity that caused your condition. ?Doing exercise and activities to strengthen and stretch your muscles and tendons (physical therapy). ?Making lifestyle changes to help with your condition and learning how to do your daily activities safely (occupational therapy). ?Medicines for pain and inflammation. This may include medicine that is injected into your wrist. ?A wrist splint or brace. ?Surgery. ?Follow these instructions at home: ?If you have a splint or brace: ?Wear the splint or brace as told by your health care provider. Remove it only as told by your health care provider. ?Loosen the splint or brace if your fingers tingle, become numb, or turn cold and blue. ?Keep the splint  or brace clean. ?If the splint or brace is not waterproof: ?Do not let it get wet. ?Cover it with a watertight covering when you take a bath or shower. ?Managing pain, stiffness, and swelling ?If directed, put ice on the  painful area. To do this: ?If you have a removeable splint or brace, remove it as told by your health care provider. ?Put ice in a plastic bag. ?Place a towel between your skin and the bag or between the splint or brace and the bag. ?Leave the ice on for 20 minutes, 2-3 times a day. Do not fall asleep with the cold pack on your skin. ?Remove the ice if your skin turns bright red. This is very important. If you cannot feel pain, heat, or cold, you have a greater risk of damage to the area. ?Move your fingers often to reduce stiffness and swelling. ?General instructions ?Take over-the-counter and prescription medicines only as told by your health care provider. ?Rest your wrist and hand from any activity that may be causing your pain. If your condition is work related, talk with your employer about changes that can be made, such as getting a wrist pad to use while typing. ?Do any exercises as told by your health care provider, physical therapist, or occupational therapist. ?Keep all follow-up visits. This is important. ?Contact a health care provider if: ?You have new symptoms. ?Your pain is not controlled with medicines. ?Your symptoms get worse. ?Get help right away if: ?You have severe numbness or tingling in your wrist or hand. ?Summary ?Carpal tunnel syndrome is a condition that causes pain, numbness, and weakness in your hand and fingers. ?It is usually caused by repeated wrist motions. ?Lifestyle changes and medicines are used to treat carpal tunnel syndrome. Surgery may be recommended. ?Follow your health care provider's instructions about wearing a splint, resting from activity, keeping follow-up visits, and calling for help. ?This information is not intended to replace advice given to you by your health care provider. Make sure you discuss any questions you have with your health care provider. ?Document Revised: 02/29/2020 Document Reviewed: 02/29/2020 ?Elsevier Patient Education ? [place]. ? ?Paresthesia ?Paresthesia is an abnormal burning or prickling sensation. It is usually felt in the hands, arms, legs, or feet. However, it may occur in any part of the body. Usually, paresthesia is not painful. It may feel like: ?Tingling or numbness. ?Buzzing. ?Itching. ?Paresthesia may occur without any clear cause, or it may be caused by: ?Breathing too quickly (hyperventilation). ?Pressure on a nerve. ?An underlying medical condition. ?Side effects of a medicine. ?Nutritional deficiencies. ?Exposure to toxic chemicals. ?Most people experience temporary (transient) paresthesia at some time in their lives. For some people, it may be long-lasting (chronic) because of an underlying medical condition. If you have paresthesia that lasts a long time, you need to be evaluated by your health care provider. ?Follow these instructions at home: ?Nutrition ?Eat a healthy diet. This includes: ?Eating foods that are high in fiber, such as beans, whole grains, and fresh fruits and vegetables. ?Limiting foods that are high in fat and processed sugars, such as fried or sweet foods. ? ?Alcohol use ? ?Avoid or limit alcohol. Too much alcohol can cause a vitamin B deficiency, and vitamin B is needed for healthy nerves. ?Do not drink alcohol if: ?Your health care provider tells you not to drink. ?You are pregnant, may be pregnant, or are planning to become pregnant. ?If you drink alcohol: ?Limit how much you have to: ?  0-1 drink a day for women. ?0-2 drinks a day for men. ?Know how much alcohol is in your drink. In the [place] one drink equals one 12 oz bottle of beer (355 mL), one 5 oz glass of wine (148 mL), or one 1? oz glass of hard liquor (44 mL). ?General instructions ?Take over-the-counter and prescription medicines only as told by your health care provider. ?Do not use any products that contain nicotine or tobacco. These products include cigarettes, chewing tobacco, and vaping devices, such as e-cigarettes. If you need  help quitting, ask your health care provider. ?If you have diabetes, work closely with your health care provider to keep your blood sugar under control. ?If you have numbness in your feet: ?Check every day for signs of injur

## 2022-02-27 NOTE — Progress Notes (Signed)
Chief Complaint  ?Patient presents with  ? Follow-up  ?  6 mon, pt c/o numbness and tingling in feet and hands during the night. Lightsensitivity in both eyes. Refill on Pataday  ? ?F/u with son in law  ?1. C/o numbness in hands and feet esp at night w/in the last few weeks  ?With h/o DDD cervical and lumbar  ?2. C/o eyes sensitive to light wearing sunglasses not helping and also c/o watery eyes disc'ed pataday eye drops 0.7 % qd otc and purchase otc  ?S/p b/l cataract surgery  ?3. Htn on lis 40 mg qd and norvasc 2.5 mg qd but having dry cough will change acei to losartan 50 mg qd and see if this helps with cough but for now cough from 12/2021 resolved  ?Home bp readings range 97-144/70s-80s mostly controlled 1teens-120s/70s and HR 60s  ?At times c/o heart palpiations  ? ? ?Review of Systems  ?Constitutional:  Negative for weight loss.  ?HENT:  Negative for hearing loss.   ?Eyes:  Negative for blurred vision.  ?Respiratory:  Negative for shortness of breath.   ?Cardiovascular:  Positive for palpitations. Negative for chest pain.  ?Gastrointestinal:  Negative for abdominal pain and blood in stool.  ?Genitourinary:  Negative for dysuria.  ?Musculoskeletal:  Negative for falls and joint pain.  ?Skin:  Negative for rash.  ?Neurological:  Positive for tingling and sensory change. Negative for headaches.  ?Psychiatric/Behavioral:  Negative for depression.   ?Past Medical History:  ?Diagnosis Date  ? Arthritis   ? left shoulder, right low back and right hip   ? Hyperlipidemia   ? Hypertension   ? UTI (urinary tract infection)   ? ?Past Surgical History:  ?Procedure Laterality Date  ? CATARACT EXTRACTION    ? b/l   ? 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 ?Family History  ?Problem Relation Age of Onset  ? Breast cancer Other   ? ?Social History  ? ?Socioeconomic History  ? Marital status: Divorced  ?  Spouse name: Not on file  ? Number of children: Not on file   ? Years of education: Not on file  ? Highest education level: Not on file  ?Occupational History  ? Not on file  ?Tobacco Use  ? Smoking status: Never  ? Smokeless tobacco: Never  ?Substance and Sexual Activity  ? Alcohol use: Not Currently  ? Drug use: Not Currently  ? Sexual activity: Not Currently  ?Other Topics Concern  ? Not on file  ?Social History Narrative  ? 4 daughters   ? Moved here from [place], [place].   ? Currently not working and pending insurance as of 06/30/19   ? From the [place]   ? ?Social Determinants of Health  ? ?Financial Resource Strain: Low Risk   ? Difficulty of Paying Living Expenses: Not hard at all  ?Food Insecurity: No Food Insecurity  ? Worried About Charity fundraiser in the Last Year: Never true  ? Ran Out of Food in the Last Year: Never true  ?Transportation Needs: No Transportation Needs  ? Lack of Transportation (Medical): No  ? Lack of Transportation (Non-Medical): No  ?Physical Activity: Sufficiently Active  ? Days of Exercise per Week: 7 days  ? Minutes of Exercise per Session: 30 min  ?Stress: No Stress Concern Present  ? Feeling of Stress : Only a little  ?Social Connections: Moderately Integrated  ? Frequency of Communication  with Friends and Family: Not on file  ? Frequency of Social Gatherings with Friends and Family: More than three times a week  ? Attends Religious Services: 1 to 4 times per year  ? Active Member of Clubs or Organizations: Yes  ? Attends Archivist Meetings: 1 to 4 times per year  ? Marital Status: Divorced  ?Intimate Partner Violence: Not At Risk  ? Fear of Current or Ex-Partner: No  ? Emotionally Abused: No  ? Physically Abused: No  ? Sexually Abused: No  ? ?Current Meds  ?Medication Sig  ? Ascorbic Acid (VITAMIN C PO) Take 1,000 mg by mouth daily.  ? aspirin EC 81 MG tablet Take 81 mg by mouth daily.  ? azelastine (ASTELIN) 0.1 % nasal spray Place 2 sprays into both nostrils 2 (two) times daily. Use in each nostril as directed  ? B Complex  Vitamins (VITAMIN B COMPLEX PO) Take by mouth.  ? cetirizine (ZYRTEC) 10 MG tablet Take 1 tablet (10 mg total) by mouth at bedtime as needed for allergies.  ? Cholecalciferol (VITAMIN D3) 50 MCG (2000 UT) capsule Take 5,000 Units by mouth daily.  ? dicyclomine (BENTYL) 20 MG tablet Take 1 tablet (20 mg total) by mouth every 12 (twelve) hours as needed for spasms.  ? losartan (COZAAR) 50 MG tablet Take 1 tablet (50 mg total) by mouth daily. D/c lisinopril dry cough  ? metoprolol succinate (TOPROL-XL) 25 MG 24 hr tablet Take 1 tablet (25 mg total) by mouth daily.  ? multivitamin-lutein (OCUVITE-LUTEIN) CAPS capsule Take 1 capsule by mouth daily.  ? Omega-3 Fatty Acids (OMEGA 3 PO) Take 600 mg by mouth daily.  ? Polyethyl Glycol-Propyl Glycol (SYSTANE OP) Apply to eye.  ? simvastatin (ZOCOR) 20 MG tablet TAKE 1 TABLET BY MOUTH ONCE DAILY AT  6PM  ? Suvorexant (BELSOMRA) 5 MG TABS Take 5 mg by mouth at bedtime as needed.  ? [DISCONTINUED] amLODipine (NORVASC) 2.5 MG tablet Take 1 tablet (2.5 mg total) by mouth daily. If BP>130/>80  ? [DISCONTINUED] HYDROcodone bit-homatropine (HYCODAN) 5-1.5 MG/5ML syrup Take 5 mLs by mouth at bedtime as needed for cough.  ? [DISCONTINUED] lisinopril (ZESTRIL) 40 MG tablet Take 1 tablet (40 mg total) by mouth daily. D/c lis 20-hctz 25  ? ?Allergies  ?Allergen Reactions  ? Acetaminophen Other (See Comments)  ?  Cold sweats, general malaise  ? Hctz [Hydrochlorothiazide]   ?  Hyponatremia ?  ? Lisinopril   ?  Dry cough  ? Tylenol With Codeine #3  [Acetaminophen-Codeine]   ?  Other reaction(s): Unknown  ? ?Recent Results (from the past 2160 hour(s))  ?I-STAT creatinine     Status: None  ? Collection Time: 02/16/22 11:23 AM  ?Result Value Ref Range  ? Creatinine, Ser 0.80 0.44 - 1.00 mg/dL  ?Comprehensive metabolic panel     Status: Abnormal  ? Collection Time: 02/17/22  9:25 AM  ?Result Value Ref Range  ? Sodium 134 (L) 135 - 145 mmol/L  ? Potassium 4.0 3.5 - 5.1 mmol/L  ? Chloride 99 98 -  111 mmol/L  ? CO2 30 22 - 32 mmol/L  ? Glucose, Bld 109 (H) 70 - 99 mg/dL  ?  Comment: Glucose reference range applies only to samples taken after fasting for at least 8 hours.  ? BUN 11 8 - 23 mg/dL  ? Creatinine, Ser 0.82 0.44 - 1.00 mg/dL  ? Calcium 9.0 8.9 - 10.3 mg/dL  ? Total Protein 7.6 6.5 - 8.1  g/dL  ? Albumin 4.1 3.5 - 5.0 g/dL  ? AST 27 15 - 41 U/L  ? ALT 23 0 - 44 U/L  ? Alkaline Phosphatase 59 38 - 126 U/L  ? Total Bilirubin 0.7 0.3 - 1.2 mg/dL  ? GFR, Estimated >60 >60 mL/min  ?  Comment: (NOTE) ?Calculated using the CKD-EPI Creatinine Equation (2021) ?  ? Anion gap 5 5 - 15  ?  Comment: Performed at [facility], [address], [place], [place] [address]  ?CBC with Differential/Platelet     Status: None  ? Collection Time: 02/17/22  9:25 AM  ?Result Value Ref Range  ? WBC 6.4 4.0 - 10.5 K/uL  ? RBC 4.32 3.87 - 5.11 MIL/uL  ? Hemoglobin 12.9 12.0 - 15.0 g/dL  ? HCT 40.0 36.0 - 46.0 %  ? MCV 92.6 80.0 - 100.0 fL  ? MCH 29.9 26.0 - 34.0 pg  ? MCHC 32.3 30.0 - 36.0 g/dL  ? RDW 12.4 11.5 - 15.5 %  ? Platelets 214 150 - 400 K/uL  ? nRBC 0.0 0.0 - 0.2 %  ? Neutrophils Relative % 72 %  ? Neutro Abs 4.7 1.7 - 7.7 K/uL  ? Lymphocytes Relative 18 %  ? Lymphs Abs 1.1 0.7 - 4.0 K/uL  ? Monocytes Relative 6 %  ? Monocytes Absolute 0.4 0.1 - 1.0 K/uL  ? Eosinophils Relative 3 %  ? Eosinophils Absolute 0.2 0.0 - 0.5 K/uL  ? Basophils Relative 1 %  ? Basophils Absolute 0.1 0.0 - 0.1 K/uL  ? Immature Granulocytes 0 %  ? Abs Immature Granulocytes 0.01 0.00 - 0.07 K/uL  ?  Comment: Performed at [facility], [address], [place], [place] [address]  ?Vitamin B12     Status: None  ? Collection Time: 02/27/22 10:08 AM  ?Result Value Ref Range  ? Vitamin B-12 617 211 - 911 pg/mL  ?Folate     Status: None  ? Collection Time: 02/27/22 10:08 AM  ?Result Value Ref Range  ? Folate >23.5 >5.9 ng/mL  ?Sedimentation rate     Status: Abnormal  ? Collection Time: 02/27/22 10:08 AM  ?Result Value Ref Range  ? Sed Rate  31 (H) 0 - 30 mm/hr  ?TSH     Status: None  ? Collection Time: 02/27/22 10:08 AM  ?Result Value Ref Range  ? TSH 1.22 0.35 - 5.50 uIU/mL  ?Hemoglobin A1c     Status: None  ? Collection Time: 02/27/22 1

## 2022-03-03 ENCOUNTER — Telehealth: Payer: Self-pay

## 2022-03-03 DIAGNOSIS — R200 Anesthesia of skin: Secondary | ICD-10-CM | POA: Insufficient documentation

## 2022-03-03 DIAGNOSIS — R202 Paresthesia of skin: Secondary | ICD-10-CM | POA: Insufficient documentation

## 2022-03-03 LAB — PROTEIN ELECTROPHORESIS, URINE REFLEX
Albumin ELP, Urine: 0 %
Alpha-1-Globulin, U: 0 %
Alpha-2-Globulin, U: 0 %
Beta Globulin, U: 0 %
Gamma Globulin, U: 0 %
Protein, Ur: <4 mg/dL

## 2022-03-03 LAB — PROTEIN ELECTROPHORESIS, SERUM
Albumin ELP: 4.4 g/dL (ref 3.8–4.8)
Alpha 1: 0.3 g/dL (ref 0.2–0.3)
Alpha 2: 0.7 g/dL (ref 0.5–0.9)
Beta 2: 0.4 g/dL (ref 0.2–0.5)
Beta Globulin: 0.5 g/dL (ref 0.4–0.6)
Gamma Globulin: 1.4 g/dL (ref 0.8–1.7)
Total Protein: 7.6 g/dL (ref 6.1–8.1)

## 2022-03-03 NOTE — Telephone Encounter (Signed)
Called pt's daughter [name] to disc pt's lab results. Unable to reach nor lvm due to vm not being setup.  ?

## 2022-03-03 NOTE — Addendum Note (Signed)
Addended by: Orland Mustard on: 03/03/2022 05:37 PM ? ? Modules accepted: Orders ? ?

## 2022-03-17 ENCOUNTER — Encounter: Payer: Self-pay | Admitting: Internal Medicine

## 2022-04-02 ENCOUNTER — Ambulatory Visit: Payer: Medicare Other | Admitting: Dermatology

## 2022-04-10 ENCOUNTER — Ambulatory Visit: Payer: Medicare Other | Admitting: Internal Medicine

## 2022-05-11 ENCOUNTER — Encounter: Payer: Self-pay | Admitting: Internal Medicine

## 2022-05-11 ENCOUNTER — Telehealth: Payer: Self-pay | Admitting: Internal Medicine

## 2022-05-11 NOTE — Telephone Encounter (Signed)
Letter complete.

## 2022-05-11 NOTE — Telephone Encounter (Signed)
Pt daughter called in requesting for letter/note for accommodation for airport... Pt daughter stated that pt is soon to be traveling to [place] for vacation... Pt daughter stated that pt is requesting wheel service at the airport... Pt daughter stated that the airport needs letter/note for the accommodation... Pt daughter is requesting that the letter/note to be sent to mychart.Marland KitchenMarland Kitchen

## 2022-05-12 ENCOUNTER — Telehealth: Payer: Self-pay | Admitting: Oncology

## 2022-05-12 NOTE — Telephone Encounter (Signed)
Patient called and left voicemail cancel appointment.

## 2022-08-24 ENCOUNTER — Other Ambulatory Visit: Payer: Medicare Other

## 2022-08-27 ENCOUNTER — Ambulatory Visit: Payer: Medicare Other | Admitting: Oncology

## 2022-08-27 ENCOUNTER — Other Ambulatory Visit: Payer: Medicare Other
